# Patient Record
Sex: Female | Born: 1980 | Race: White | Hispanic: No | Marital: Married | Smoking: Never smoker
Health system: Southern US, Community
[De-identification: ages and names within clinical notes are randomized; demographics above are authoritative.]

## PROBLEM LIST (undated history)

## (undated) DIAGNOSIS — R87619 Unspecified abnormal cytological findings in specimens from cervix uteri: Secondary | ICD-10-CM

## (undated) DIAGNOSIS — D259 Leiomyoma of uterus, unspecified: Secondary | ICD-10-CM

## (undated) DIAGNOSIS — N840 Polyp of corpus uteri: Secondary | ICD-10-CM

## (undated) DIAGNOSIS — N939 Abnormal uterine and vaginal bleeding, unspecified: Secondary | ICD-10-CM

## (undated) DIAGNOSIS — I10 Essential (primary) hypertension: Secondary | ICD-10-CM

## (undated) DIAGNOSIS — D649 Anemia, unspecified: Secondary | ICD-10-CM

## (undated) HISTORY — PX: MYOMECTOMY: SHX85

## (undated) HISTORY — DX: Unspecified abnormal cytological findings in specimens from cervix uteri: R87.619

## (undated) HISTORY — PX: TONSILLECTOMY: SUR1361

---
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
## 2006-10-08 ENCOUNTER — Emergency Department: Payer: Self-pay | Admitting: Emergency Medicine

## 2007-06-25 ENCOUNTER — Inpatient Hospital Stay: Payer: Self-pay

## 2007-07-05 ENCOUNTER — Emergency Department: Payer: Self-pay | Admitting: Emergency Medicine

## 2007-08-23 ENCOUNTER — Emergency Department: Payer: Self-pay | Admitting: Unknown Physician Specialty

## 2007-08-23 ENCOUNTER — Other Ambulatory Visit: Payer: Self-pay

## 2010-07-23 ENCOUNTER — Emergency Department: Payer: Self-pay | Admitting: Emergency Medicine

## 2012-12-27 ENCOUNTER — Ambulatory Visit: Payer: Self-pay | Admitting: Obstetrics and Gynecology

## 2015-10-31 ENCOUNTER — Emergency Department
Admission: EM | Admit: 2015-10-31 | Discharge: 2015-11-01 | Disposition: A | Discharge status: Home / Self Care | Payer: BLUE CROSS/BLUE SHIELD | Attending: Emergency Medicine | Admitting: Emergency Medicine

## 2015-10-31 ENCOUNTER — Encounter: Payer: Self-pay | Admitting: Emergency Medicine

## 2015-10-31 DIAGNOSIS — N939 Abnormal uterine and vaginal bleeding, unspecified: Secondary | ICD-10-CM | POA: Insufficient documentation

## 2015-10-31 DIAGNOSIS — Z3202 Encounter for pregnancy test, result negative: Secondary | ICD-10-CM | POA: Diagnosis not present

## 2015-10-31 DIAGNOSIS — N888 Other specified noninflammatory disorders of cervix uteri: Secondary | ICD-10-CM

## 2015-10-31 LAB — CBC WITH DIFFERENTIAL/PLATELET
BASOS ABS: 0.1 10*3/uL (ref 0–0.1)
BASOS PCT: 1 %
EOS PCT: 2 %
Eosinophils Absolute: 0.1 10*3/uL (ref 0–0.7)
HEMATOCRIT: 34 % — AB (ref 35.0–47.0)
Hemoglobin: 11.2 g/dL — ABNORMAL LOW (ref 12.0–16.0)
Lymphocytes Relative: 30 %
Lymphs Abs: 2.2 10*3/uL (ref 1.0–3.6)
MCH: 28 pg (ref 26.0–34.0)
MCHC: 32.8 g/dL (ref 32.0–36.0)
MCV: 85.3 fL (ref 80.0–100.0)
Monocytes Absolute: 0.7 10*3/uL (ref 0.2–0.9)
Monocytes Relative: 9 %
NEUTROS ABS: 4.3 10*3/uL (ref 1.4–6.5)
Neutrophils Relative %: 58 %
PLATELETS: 198 10*3/uL (ref 150–440)
RBC: 3.99 MIL/uL (ref 3.80–5.20)
RDW: 14.7 % — ABNORMAL HIGH (ref 11.5–14.5)
WBC: 7.3 10*3/uL (ref 3.6–11.0)

## 2015-10-31 LAB — BASIC METABOLIC PANEL
ANION GAP: 4 — AB (ref 5–15)
BUN: 11 mg/dL (ref 6–20)
CO2: 29 mmol/L (ref 22–32)
Calcium: 9.1 mg/dL (ref 8.9–10.3)
Chloride: 108 mmol/L (ref 101–111)
Creatinine, Ser: 1.28 mg/dL — ABNORMAL HIGH (ref 0.44–1.00)
GFR calc non Af Amer: 55 mL/min — ABNORMAL LOW (ref >60)
GLUCOSE: 118 mg/dL — AB (ref 65–99)
POTASSIUM: 3.9 mmol/L (ref 3.5–5.1)
Sodium: 141 mmol/L (ref 135–145)

## 2015-10-31 NOTE — ED Provider Notes (Signed)
[facility] Emergency Department Provider Note     Time seen: ----------------------------------------- 11:06 PM on 10/31/2015 -----------------------------------------    I have reviewed the triage vital signs and the nursing notes.   HISTORY  Chief Complaint Vaginal Bleeding    HPI Paige Perez is a 35 y.o. female who presents ER for heavy vaginal bleeding for last 4 weeks. Patient states tonight she had heavy bleeding and that she went to change her tampon without resistance and try to put a tampon in. Patient states she felt with her finger something that felt like a ball or something obstructing her vagina. Patient is a had this before, denies putting anything other than a tampon in her vagina recently.   History reviewed. No pertinent past medical history.  There are no active problems to display for this patient.   History reviewed. No pertinent past surgical history.  Allergies Review of patient's allergies indicates no known allergies.  Social History Social History  Substance Use Topics  . Smoking status: Never Smoker   . Smokeless tobacco: None  . Alcohol Use: Yes     Comment: occasionaly    Review of Systems Constitutional: Negative for fever. Eyes: Negative for visual changes. ENT: Negative for sore throat. Cardiovascular: Negative for chest pain. Respiratory: Negative for shortness of breath. Gastrointestinal: Negative for abdominal pain, vomiting and diarrhea. Genitourinary: Negative for dysuria. Positive for vaginal bleeding and foreign body sensation Musculoskeletal: Negative for back pain. Skin: Negative for rash. Neurological: Negative for headaches, focal weakness or numbness.  10-point ROS otherwise negative.  ____________________________________________   PHYSICAL EXAM:  VITAL SIGNS: ED Triage Vitals  Enc Vitals Group     BP 10/31/15 2233 189/102 mmHg     Pulse Rate 10/31/15 2233 100     Resp 10/31/15  2233 18     Temp 10/31/15 2233 98 F (36.7 C)     Temp Source 10/31/15 2233 Oral     SpO2 10/31/15 2233 100 %     Weight 10/31/15 2233 270 lb (122.471 kg)     Height 10/31/15 2233 5\' 6"  (1.676 m)     Head Cir --      Peak Flow --      Pain Score --      Pain Loc --      Pain Edu? --      Excl. in [place]? --     Constitutional: Alert and oriented. Well appearing and in no distress. Eyes: Conjunctivae are normal. PERRL. Normal extraocular movements. ENT   Head: Normocephalic and atraumatic.   Nose: No congestion/rhinnorhea.   Mouth/Throat: Mucous membranes are moist.   Neck: No stridor. Cardiovascular: Normal rate, regular rhythm. Normal and symmetric distal pulses are present in all extremities. No murmurs, rubs, or gallops. Respiratory: Normal respiratory effort without tachypnea nor retractions. Breath sounds are clear and equal bilaterally. No wheezes/rales/rhonchi. Gastrointestinal: Soft and nontender. No distention. No abdominal bruits.  Genitourinary: Apparent pelvic organ prolapse. I'm not able to completely reduce it. Nontender, mild bleeding Musculoskeletal: Nontender with normal range of motion in all extremities. No joint effusions.  No lower extremity tenderness nor edema. Neurologic:  Normal speech and language. No gross focal neurologic deficits are appreciated. Speech is normal. No gait instability. Skin:  Skin is warm, dry and intact. No rash noted. Psychiatric: Mood and affect are normal. Speech and behavior are normal. Patient exhibits appropriate insight and judgment. ____________________________________________  ED COURSE:  Pertinent labs & imaging results that were available during my  care of the patient were reviewed by me and considered in my medical decision making (see chart for details). Patient is in no acute distress, will perform pelvic exam, check basic labs and reevaluate. ____________________________________________    LABS (pertinent  positives/negatives)  Labs Reviewed  CBC WITH DIFFERENTIAL/PLATELET - Abnormal; Notable for the following:    Hemoglobin 11.2 (*)    HCT 34.0 (*)    RDW 14.7 (*)    All other components within normal limits  BASIC METABOLIC PANEL - Abnormal; Notable for the following:    Glucose, Bld 118 (*)    Creatinine, Ser 1.28 (*)    GFR calc non Af Amer 55 (*)    Anion gap 4 (*)    All other components within normal limits  URINALYSIS COMPLETEWITH MICROSCOPIC ([facility] ONLY) - Abnormal; Notable for the following:    Color, Urine YELLOW (*)    APPearance CLEAR (*)    Hgb urine dipstick 3+ (*)    Leukocytes, UA TRACE (*)    Bacteria, UA RARE (*)    Squamous Epithelial / LPF 0-5 (*)    All other components within normal limits  POC URINE PREG, ED  POCT PREGNANCY, URINE    Pelvic ultrasound:  IMPRESSION: Heterogeneous mass lesion demonstrated in the cervix and lower uterine segment measuring about 4.2 cm maximal diameter. Increased flow is demonstrated within the mass and within the endometrium. Neoplasm should be excluded. Gynecological consultation is suggested. ____________________________________________  FINAL ASSESSMENT AND PLAN  Vaginal bleeding, Mass on the cervix  Plan: Patient with labs and imaging as dictated above. Patient with a mass on her cervix and lower uterine segment. I discussed with Dr. Glennon Mac on call. Patient receives Dr. Laurey Morale, can have a Pap smear or biopsy this week for further evaluation. She will be encouraged to see him on Monday for recheck.   Earleen Newport, MD   Earleen Newport, MD 11/01/15 [phone]

## 2015-10-31 NOTE — ED Notes (Signed)
Patient reports that she has been having vaginal bleeding times 4 weeks. Patient states that tonight she had heavy bleeding. Patient states that when she went to change your tampon she felt resistance when trying to put a tampon in. Patient states that she felt with her finger and felt a "ball".

## 2015-11-01 ENCOUNTER — Emergency Department: Payer: BLUE CROSS/BLUE SHIELD

## 2015-11-01 LAB — URINALYSIS COMPLETE WITH MICROSCOPIC (ARMC ONLY)
Bilirubin Urine: NEGATIVE
Glucose, UA: NEGATIVE mg/dL
Ketones, ur: NEGATIVE mg/dL
NITRITE: NEGATIVE
PH: 6 (ref 5.0–8.0)
Protein, ur: NEGATIVE mg/dL
SPECIFIC GRAVITY, URINE: 1.018 (ref 1.005–1.030)

## 2015-11-01 LAB — POCT PREGNANCY, URINE: Preg Test, Ur: NEGATIVE

## 2015-11-01 NOTE — Discharge Instructions (Signed)
Abnormal Uterine Bleeding Abnormal uterine bleeding means bleeding from the vagina that is not your normal menstrual period. This can be:  Bleeding or spotting between periods.  Bleeding after sex (sexual intercourse).  Bleeding that is heavier or more than normal.  Periods that last longer than usual.  Bleeding after menopause. There are many problems that may cause this. Treatment will depend on the cause of the bleeding. Any kind of bleeding that is not normal should be reviewed by your doctor.  HOME CARE Watch your condition for any changes. These actions may lessen any discomfort you are having:  Do not use tampons or douches as told by your doctor.  Change your pads often. You should get regular pelvic exams and Pap tests. Keep all appointments for tests as told by your doctor. GET HELP IF:  You are bleeding for more than 1 week.  You feel dizzy at times. GET HELP RIGHT AWAY IF:   You pass out.  You have to change pads every 15 to 30 minutes.  You have belly pain.  You have a fever.  You become sweaty or weak.  You are passing large blood clots from the vagina.  You feel sick to your stomach (nauseous) and throw up (vomit). MAKE SURE YOU:  Understand these instructions.  Will watch your condition.  Will get help right away if you are not doing well or get worse.   This information is not intended to replace advice given to you by your health care provider. Make sure you discuss any questions you have with your health care provider.   Document Released: 07/17/2009 Document Revised: 09/24/2013 Document Reviewed: 04/18/2013 Elsevier Interactive Patient Education 2016 Else[employer]  Pelvic Mass A pelvic mass is an abnormal growth in the pelvis. The pelvis is the area between your hip bones. It includes the bladder and the rectum in males and females, and also the uterus and ovaries in females. CAUSES Many things can cause a pelvic mass,  including:  Cancer.  Fibroids of the uterus.  Ovarian cysts.  Infection.  Ectopic pregnancy. SIGNS AND SYMPTOMS Symptoms of a pelvic mass may include:  Cramping.  Nausea.  Diarrhea.  Fever.  Vomiting.  Weakness.  Pain in the pelvis, side, or back.  Weight loss.  Constipation.  Problems with vaginal bleeding, including:  Light or heavy bleeding with or without blood clots.  Irregular menstruation.  Pain with menstruation.  Problems with urination, including:  Frequent urination.  Inability to empty the bladder completely.  Urinating very small amounts.  Pain with urination.  Bloody urine. Some pelvic masses do not cause symptoms. DIAGNOSIS To make a diagnosis, your health care provider will need to learn more about the mass. You may have tests or procedures done, such as:  Blood tests.  X-rays.  Ultrasound.  CT scan.  MRI.  A surgery to look inside of your abdomen with cameras (laparoscopy).  A biopsy that is performed with a needle or during laparoscopy or surgery. In some cases, what seemed like a pelvic mass may actually be something else, such as a mass in one of the organs that are near the pelvis, an infection (abscess) or scar tissue (adhesions) that formed after a surgery. TREATMENT Treatment will depend on the cause of the mass. HOME CARE INSTRUCTIONS What you need to do at home will depend on the cause of the mass. Follow the instructions that your health care provider gives to you. In general:  Keep all follow-up visits as  directed by your health care provider. This is important.  Take medicines only as directed by your health care provider.  Follow any restrictions that are given to you by your health care provider. SEEK MEDICAL CARE IF:  You develop new symptoms. SEEK IMMEDIATE MEDICAL CARE IF:  You vomit bright red blood or vomit material that looks like coffee grounds.  You have blood in your stools, or the stools  turn black and tarry.  You have an abnormal or increased amount of vaginal bleeding.  You have a fever.  You develop easy bruising or bleeding.  You develop sudden or worsening pain that is not controlled by your medicine.  You feel worsening weakness, or you have a fainting episode.  You feel that the mass has suddenly gotten larger.  You develop severe bloating in your abdomen or your pelvis.  You cannot pass any urine.  You are unable to have a bowel movement.   This information is not intended to replace advice given to you by your health care provider. Make sure you discuss any questions you have with your health care provider.   Document Released: 12/27/2006 Document Revised: 10/10/2014 Document Reviewed: 05/05/2014 Elsevier Interactive Patient Education [employer].

## 2015-11-01 NOTE — ED Notes (Signed)
Pt. Will call for appointment today.  Pt. Going home with family.

## 2015-11-04 ENCOUNTER — Inpatient Hospital Stay: Admission: RE | Admit: 2015-11-04 | Payer: Self-pay | Source: Ambulatory Visit

## 2015-11-04 ENCOUNTER — Encounter: Payer: Self-pay | Admitting: *Deleted

## 2015-11-04 NOTE — Patient Instructions (Signed)
  Your procedure is scheduled on: 11-06-15 Report to [place] To find out your arrival time please call [phone] between 1PM - 3PM on 11-05-15  Remember: Instructions that are not followed completely may result in serious medical risk, up to and including death, or upon the discretion of your surgeon and anesthesiologist your surgery may need to be rescheduled.    _X___ 1. Do not eat food or drink liquids after midnight. No gum chewing or hard candies.     _X___ 2. No Alcohol for 24 hours before or after surgery.   ____ 3. Bring all medications with you on the day of surgery if instructed.    ____ 4. Notify your doctor if there is any change in your medical condition     (cold, fever, infections).     Do not wear jewelry, make-up, hairpins, clips or nail polish.  Do not wear lotions, powders, or perfumes. You may wear deodorant.  Do not shave 48 hours prior to surgery. Men may shave face and neck.  Do not bring valuables to the hospital.    [facility] is not responsible for any belongings or valuables.               Contacts, dentures or bridgework may not be worn into surgery.  Leave your suitcase in the car. After surgery it may be brought to your room.  For patients admitted to the hospital, discharge time is determined by your treatment team.   Patients discharged the day of surgery will not be allowed to drive home.   Please read over the following fact sheets that you were given:     ____ Take these medicines the morning of surgery with A SIP OF WATER:    1. NONE  2.   3.   4.  5.  6.  ____ Fleet Enema (as directed)   ____ Use CHG Soap as directed  ____ Use inhalers on the day of surgery  ____ Stop metformin 2 days prior to surgery    ____ Take 1/2 of usual insulin dose the night before surgery and none on the morning of surgery.   ____ Stop Coumadin/Plavix/aspirin-N/A  ____ Stop Anti-inflammatories-NO NSAIDS OR ASA  PRODUCTS-TYLENOL OK TO TAKE   ____ Stop supplements until after surgery.    ____ Bring C-Pap to the hospital.

## 2015-11-06 ENCOUNTER — Ambulatory Visit: Payer: BLUE CROSS/BLUE SHIELD | Admitting: Anesthesiology

## 2015-11-06 ENCOUNTER — Ambulatory Visit
Admission: RE | Admit: 2015-11-06 | Discharge: 2015-11-06 | Disposition: A | Discharge status: Home / Self Care | Payer: BLUE CROSS/BLUE SHIELD | Source: Ambulatory Visit | Attending: Obstetrics and Gynecology | Admitting: Obstetrics and Gynecology

## 2015-11-06 ENCOUNTER — Encounter: Payer: Self-pay | Admitting: Anesthesiology

## 2015-11-06 ENCOUNTER — Encounter
Admission: RE | Disposition: A | Discharge status: Home / Self Care | Payer: Self-pay | Source: Ambulatory Visit | Attending: Obstetrics and Gynecology

## 2015-11-06 DIAGNOSIS — Z820 Family history of epilepsy and other diseases of the nervous system: Secondary | ICD-10-CM | POA: Insufficient documentation

## 2015-11-06 DIAGNOSIS — N711 Chronic inflammatory disease of uterus: Secondary | ICD-10-CM | POA: Diagnosis not present

## 2015-11-06 DIAGNOSIS — Z833 Family history of diabetes mellitus: Secondary | ICD-10-CM | POA: Diagnosis not present

## 2015-11-06 DIAGNOSIS — Z79899 Other long term (current) drug therapy: Secondary | ICD-10-CM | POA: Diagnosis not present

## 2015-11-06 DIAGNOSIS — D251 Intramural leiomyoma of uterus: Secondary | ICD-10-CM | POA: Insufficient documentation

## 2015-11-06 DIAGNOSIS — N888 Other specified noninflammatory disorders of cervix uteri: Secondary | ICD-10-CM | POA: Diagnosis present

## 2015-11-06 HISTORY — PX: DILATATION & CURETTAGE/HYSTEROSCOPY WITH MYOSURE: SHX6511

## 2015-11-06 HISTORY — DX: Anemia, unspecified: D64.9

## 2015-11-06 HISTORY — PX: HYSTEROSCOPY: SHX211

## 2015-11-06 LAB — TYPE AND SCREEN
ABO/RH(D): A POS
Antibody Screen: NEGATIVE

## 2015-11-06 LAB — CBC
HEMATOCRIT: 32.8 % — AB (ref 35.0–47.0)
Hemoglobin: 10.9 g/dL — ABNORMAL LOW (ref 12.0–16.0)
MCH: 27.9 pg (ref 26.0–34.0)
MCHC: 33.1 g/dL (ref 32.0–36.0)
MCV: 84.3 fL (ref 80.0–100.0)
Platelets: 218 10*3/uL (ref 150–440)
RBC: 3.89 MIL/uL (ref 3.80–5.20)
RDW: 14.6 % — AB (ref 11.5–14.5)
WBC: 7.3 10*3/uL (ref 3.6–11.0)

## 2015-11-06 LAB — COMPREHENSIVE METABOLIC PANEL
ALT: 12 U/L — AB (ref 14–54)
AST: 14 U/L — AB (ref 15–41)
Albumin: 3.3 g/dL — ABNORMAL LOW (ref 3.5–5.0)
Alkaline Phosphatase: 49 U/L (ref 38–126)
Anion gap: 7 (ref 5–15)
BILIRUBIN TOTAL: 0.5 mg/dL (ref 0.3–1.2)
BUN: 13 mg/dL (ref 6–20)
CO2: 26 mmol/L (ref 22–32)
CREATININE: 0.98 mg/dL (ref 0.44–1.00)
Calcium: 8.8 mg/dL — ABNORMAL LOW (ref 8.9–10.3)
Chloride: 105 mmol/L (ref 101–111)
Glucose, Bld: 99 mg/dL (ref 65–99)
Potassium: 4.1 mmol/L (ref 3.5–5.1)
Sodium: 138 mmol/L (ref 135–145)
TOTAL PROTEIN: 6.9 g/dL (ref 6.5–8.1)

## 2015-11-06 LAB — POCT PREGNANCY, URINE: PREG TEST UR: NEGATIVE

## 2015-11-06 LAB — ABO/RH: ABO/RH(D): A POS

## 2015-11-06 SURGERY — DILATATION & CURETTAGE/HYSTEROSCOPY WITH MYOSURE
Anesthesia: General | Site: Vagina | Wound class: Clean Contaminated

## 2015-11-06 MED ORDER — DEXAMETHASONE SODIUM PHOSPHATE 10 MG/ML IJ SOLN
INTRAMUSCULAR | Status: DC | PRN
  Administered 2015-11-06: 10 mg via INTRAVENOUS

## 2015-11-06 MED ORDER — FENTANYL CITRATE (PF) 100 MCG/2ML IJ SOLN: 25.0000 ug | INTRAMUSCULAR | Status: DC | PRN

## 2015-11-06 MED ORDER — HYDROCODONE-ACETAMINOPHEN 5-325 MG PO TABS: 1.0000 | ORAL_TABLET | Freq: Four times a day (QID) | ORAL | Status: DC | PRN

## 2015-11-06 MED ORDER — ONDANSETRON HCL 4 MG/2ML IJ SOLN
INTRAMUSCULAR | Status: DC | PRN
  Administered 2015-11-06: 4 mg via INTRAVENOUS

## 2015-11-06 MED ORDER — LACTATED RINGERS IV SOLN
INTRAVENOUS | Status: DC
  Administered 2015-11-06: 125 mL/h via INTRAVENOUS
  Administered 2015-11-06: 12:00:00 via INTRAVENOUS

## 2015-11-06 MED ORDER — ONDANSETRON HCL 4 MG/2ML IJ SOLN
INTRAMUSCULAR | Status: AC
  Administered 2015-11-06: 4 mg via INTRAVENOUS
  Filled 2015-11-06: qty 2

## 2015-11-06 MED ORDER — 0.9 % SODIUM CHLORIDE (POUR BTL) OPTIME
TOPICAL | Status: DC | PRN
  Administered 2015-11-06: 500 mL

## 2015-11-06 MED ORDER — ONDANSETRON HCL 4 MG/2ML IJ SOLN
4.0000 mg | Freq: Once | INTRAMUSCULAR | Status: AC | PRN
  Administered 2015-11-06: 4 mg via INTRAVENOUS

## 2015-11-06 MED ORDER — LACTATED RINGERS IR SOLN
Status: DC | PRN
  Administered 2015-11-06: 1000 mL
  Administered 2015-11-06: 3000 mL
  Administered 2015-11-06: 1000 mL
  Administered 2015-11-06 (×3): 3000 mL

## 2015-11-06 MED ORDER — MIDAZOLAM HCL 2 MG/2ML IJ SOLN
INTRAMUSCULAR | Status: DC | PRN
  Administered 2015-11-06: 2 mg via INTRAVENOUS

## 2015-11-06 MED ORDER — LIDOCAINE HCL (CARDIAC) 20 MG/ML IV SOLN
INTRAVENOUS | Status: DC | PRN
  Administered 2015-11-06: 80 mg via INTRAVENOUS

## 2015-11-06 MED ORDER — LACTATED RINGERS IV SOLN: INTRAVENOUS | Status: DC

## 2015-11-06 MED ORDER — FAMOTIDINE 20 MG PO TABS
20.0000 mg | ORAL_TABLET | Freq: Once | ORAL | Status: AC
  Administered 2015-11-06: 20 mg via ORAL

## 2015-11-06 MED ORDER — GLYCOPYRROLATE 0.2 MG/ML IJ SOLN
INTRAMUSCULAR | Status: DC | PRN
  Administered 2015-11-06: 0.2 mg via INTRAVENOUS

## 2015-11-06 MED ORDER — GLYCINE 1.5 % IR SOLN
Status: DC | PRN
  Administered 2015-11-06 (×4): 3000 mL

## 2015-11-06 MED ORDER — FENTANYL CITRATE (PF) 100 MCG/2ML IJ SOLN
INTRAMUSCULAR | Status: DC | PRN
  Administered 2015-11-06 (×4): 50 ug via INTRAVENOUS

## 2015-11-06 MED ORDER — PROPOFOL 10 MG/ML IV BOLUS
INTRAVENOUS | Status: DC | PRN
  Administered 2015-11-06: 200 mg via INTRAVENOUS

## 2015-11-06 MED ORDER — SILVER NITRATE-POT NITRATE 75-25 % EX MISC
CUTANEOUS | Status: DC | PRN
  Administered 2015-11-06: 6

## 2015-11-06 MED ORDER — IBUPROFEN 600 MG PO TABS: 600.0000 mg | ORAL_TABLET | Freq: Four times a day (QID) | ORAL | Status: DC | PRN

## 2015-11-06 MED ORDER — FAMOTIDINE 20 MG PO TABS
ORAL_TABLET | ORAL | Status: AC
  Administered 2015-11-06: 20 mg via ORAL
  Filled 2015-11-06: qty 1

## 2015-11-06 SURGICAL SUPPLY — 34 items
ABLATOR ENDOMETRIAL MYOSURE (ABLATOR) ×6 IMPLANT
CANISTER SUC SOCK COL 7IN (MISCELLANEOUS) ×3 IMPLANT
CANISTER SUCT 3000ML PPV (MISCELLANEOUS) ×27 IMPLANT
CATH ROBINSON RED A/P 16FR (CATHETERS) ×3 IMPLANT
CORD URO TURP 10FT (MISCELLANEOUS) ×3 IMPLANT
ELECT REM PT RETURN 9FT ADLT (ELECTROSURGICAL) ×3
ELECT RESECT POWERBALL 24F (MISCELLANEOUS) ×3 IMPLANT
ELECTRODE REM PT RTRN 9FT ADLT (ELECTROSURGICAL) ×1 IMPLANT
GAUZE SPONGE 4X4 12PLY STRL (GAUZE/BANDAGES/DRESSINGS) ×3 IMPLANT
GLOVE BIO SURGEON STRL SZ 6.5 (GLOVE) ×6 IMPLANT
GLOVE BIO SURGEON STRL SZ7 (GLOVE) ×3 IMPLANT
GLOVE BIO SURGEONS STRL SZ 6.5 (GLOVE) ×3
GLOVE BIOGEL PI IND STRL 7.0 (GLOVE) ×1 IMPLANT
GLOVE BIOGEL PI IND STRL 7.5 (GLOVE) ×1 IMPLANT
GLOVE BIOGEL PI INDICATOR 7.0 (GLOVE) ×2
GLOVE BIOGEL PI INDICATOR 7.5 (GLOVE) ×2
GOWN STRL REUS W/ TWL LRG LVL3 (GOWN DISPOSABLE) ×1 IMPLANT
GOWN STRL REUS W/TWL LRG LVL3 (GOWN DISPOSABLE) ×2
HANDLE YANKAUER SUCT BULB TIP (MISCELLANEOUS) ×3 IMPLANT
IV LACTATED RINGER IRRG 3000ML (IV SOLUTION) ×2
IV LR IRRIG 3000ML ARTHROMATIC (IV SOLUTION) ×1 IMPLANT
KIT RM TURNOVER CYSTO AR (KITS) ×3 IMPLANT
LOOP SUT CHROMIC 0 SGL3 (SUTURE) ×9 IMPLANT
LOOP SUT CHROMIC 2-0 SGL1 (SUTURE) ×3 IMPLANT
MYOSURE LITE POLYP REMOVAL (MISCELLANEOUS) ×3 IMPLANT
PACK DNC HYST (MISCELLANEOUS) ×3 IMPLANT
PAD OB MATERNITY 4.3X12.25 (PERSONAL CARE ITEMS) ×3 IMPLANT
PAD PREP 24X41 OB/GYN DISP (PERSONAL CARE ITEMS) ×3 IMPLANT
PENCIL ELECTRO HAND CTR (MISCELLANEOUS) ×3 IMPLANT
SPONGE XRAY 4X4 16PLY STRL (MISCELLANEOUS) ×9 IMPLANT
TUBING CONNECTING 10 (TUBING) ×6 IMPLANT
TUBING CONNECTING 10' (TUBING) ×3
TUBING HYSTEROSCOPY DOLPHIN (MISCELLANEOUS) ×6 IMPLANT
YANKAUER SUCT BULB TIP FLEX NO (MISCELLANEOUS) ×3 IMPLANT

## 2015-11-06 NOTE — Anesthesia Postprocedure Evaluation (Signed)
Anesthesia Post Note  Patient: Paige Perez  Procedure(s) Performed: Procedure(s) (LRB): DILATATION & CURETTAGE WITH MY SURE (N/A) HYSTEROSCOPY WITH REMOVAL OF CERVICAL MASS (N/A)  Patient location during evaluation: PACU Anesthesia Type: General Level of consciousness: awake and alert Pain management: pain level controlled Vital Signs Assessment: post-procedure vital signs reviewed and stable Respiratory status: spontaneous breathing, nonlabored ventilation, respiratory function stable and patient connected to nasal cannula oxygen Cardiovascular status: blood pressure returned to baseline and stable Postop Assessment: no signs of nausea or vomiting Anesthetic complications: no    Last Vitals:  Filed Vitals:   11/06/15 1426 11/06/15 1444  BP: 139/78 131/74  Pulse: 90 87  Temp: 36.3 C   Resp: 16     Last Pain:  Filed Vitals:   11/06/15 1446  PainSc: Asleep                 Leeandra Ellerson S

## 2015-11-06 NOTE — Anesthesia Preprocedure Evaluation (Signed)
Anesthesia Evaluation  Patient identified by MRN, date of birth, ID band Patient awake    Reviewed: Allergy & Precautions, NPO status , Patient's Chart, lab work & pertinent test results, reviewed documented beta blocker date and time   Airway Mallampati: III  TM Distance: >3 FB     Dental  (+) Chipped   Pulmonary           Cardiovascular      Neuro/Psych    GI/Hepatic   Endo/Other    Renal/GU      Musculoskeletal   Abdominal   Peds  Hematology  (+) anemia ,   Anesthesia Other Findings Obesity.  Reproductive/Obstetrics                             Anesthesia Physical Anesthesia Plan  ASA: III  Anesthesia Plan: General   Post-op Pain Management:    Induction: Intravenous  Airway Management Planned: LMA  Additional Equipment:   Intra-op Plan:   Post-operative Plan:   Informed Consent: I have reviewed the patients History and Physical, chart, labs and discussed the procedure including the risks, benefits and alternatives for the proposed anesthesia with the patient or authorized representative who has indicated his/her understanding and acceptance.     Plan Discussed with: CRNA  Anesthesia Plan Comments:         Anesthesia Quick Evaluation

## 2015-11-06 NOTE — Transfer of Care (Signed)
Immediate Anesthesia Transfer of Care Note  Patient: Trevor Kehn  Procedure(s) Performed: Procedure(s): DILATATION & CURETTAGE WITH MY SURE (N/A) HYSTEROSCOPY WITH REMOVAL OF CERVICAL MASS (N/A)  Patient Location: PACU  Anesthesia Type:General  Level of Consciousness: awake, alert , oriented and patient cooperative  Airway & Oxygen Therapy: Patient Spontanous Breathing and Patient connected to face mask oxygen  Post-op Assessment: Report given to RN, Post -op Vital signs reviewed and stable and Patient moving all extremities X 4  Post vital signs: Reviewed and stable  Last Vitals:  Filed Vitals:   11/06/15 0823 11/06/15 1221  BP: 147/76 141/81  Pulse: 80 94  Temp: 36.8 C 36.5 C  Resp: 16 20    Complications: No apparent anesthesia complications

## 2015-11-06 NOTE — Anesthesia Procedure Notes (Signed)
Procedure Name: LMA Insertion Date/Time: 11/06/2015 9:13 AM Performed by: Silvana Newness Pre-anesthesia Checklist: Patient identified, Emergency Drugs available, Suction available, Patient being monitored and Timeout performed Patient Re-evaluated:Patient Re-evaluated prior to inductionOxygen Delivery Method: Circle system utilized Preoxygenation: Pre-oxygenation with 100% oxygen Intubation Type: IV induction Ventilation: Mask ventilation without difficulty LMA: LMA inserted LMA Size: 3.5 Number of attempts: 1 Placement Confirmation: positive ETCO2 and breath sounds checked- equal and bilateral Tube secured with: Tape Dental Injury: Teeth and Oropharynx as per pre-operative assessment

## 2015-11-06 NOTE — Brief Op Note (Signed)
11/06/2015  12:10 PM  PATIENT:  Michelle Nasuti  35 y.o. female  PRE-OPERATIVE DIAGNOSIS:  CERVICAL MASS  POST-OPERATIVE DIAGNOSIS:  CERVICAL MASS  PROCEDURE:  Procedure(s): DILATATION & CURETTAGE WITH MY SURE (N/A) HYSTEROSCOPY WITH REMOVAL OF CERVICAL MASS (N/A)  SURGEON:  Surgeon(s) and Role:    * Will Bonnet, MD - Primary  PHYSICIAN ASSISTANT: Sophia Caccavale, PA-S  ANESTHESIA:   general  EBL:  350 mL  IVF: 1,300 mL crystalloid  UOP: 150 mL clear urine  Fluid Deficit: 500 mL  BLOOD ADMINISTERED:none  DRAINS: none   LOCAL MEDICATIONS USED:  NONE  SPECIMEN:  Source of Specimen:  Cervical mass  DISPOSITION OF SPECIMEN:  PATHOLOGY  COUNTS:  YES  TOURNIQUET:  * No tourniquets in log *  DICTATION: .Note written in EPIC  PLAN OF CARE: Discharge to home after PACU  PATIENT DISPOSITION:  PACU - hemodynamically stable.   Delay start of Pharmacological VTE agent (>24hrs) due to surgical blood loss or risk of bleeding: yes  Prentice Docker, MD 11/06/2015 12:12 PM

## 2015-11-06 NOTE — Op Note (Signed)
Operative Report  Pre-Op Diagnosis: Cervical Mass  Post-Op Diagnosis: Cervical Mass  Procedures:  1. Hysteroscopy, dilation and curettage 2. Removal of cervical mass  Primary Surgeon: Prentice Docker, MD   Assistant Surgeon: Franchot Heidelberg, PA-S  EBL: 350 ml   IVF: 1,300 mL   Urine output: 150 mL  Fluid deficit: 500 mL  Specimens: Cervical Mass for permanent  Drains: None  Complications: None   Disposition: PACU   Condition: Stable   Findings:  1) 5-6 cm Cervical mass attached to internal cervix near internal posterior cervical os protruding through external cervical os into the vagina. 2) Normal appearing uterine cavity with no apparent masses  Indication:  The patient is a 35 y.o. female who presented to the ER several days ago for persistent vaginal bleeding with passage of clots.  She was examined and a large cervical mass was noted.  An ultrasound was obtained verifying the presence of this mass attached at either the lower uterine segment or near the internal cervical os.  She was evaluated in clinic this week with the same finding noted.  She agreed to proceed with the recommended course of treatment, which was surgical removal.  Procedure Summary:  The patient was taken to the operating room where she was placed under general anesthesia, placed in the dorsal supine lithotomy position in candycane stirrups, and prepped and draped in the usual sterile fashion. After a timeout was called her bladder was emptied using in-and-out catheterization and a sterile speculum was placed in the vagina. The cervical mass was visualized. Initially an Endoloop was used to attempt to encircle the mass at the base and reduce blood supply. This allowed for amputation of the majority of the mass. The hysteroscope was introduced through the already very dilated cervix. The uterine cavity was visualized and found to be as above. The Myosure device was used to remove the rest of the  mass. The surgery was technically difficult due to fluid loss at the cervix. An attempt to reduce fluid loss was undertaken by clamping the cervix around the hysteroscope. This did reduce the amount of fluid loss but it was still substantial. The stump of the mass began bleeding during use of the my Essure device. There was not sufficient pressure that could be maintained by intrauterine uterine fluid to attain hemostasis. Therefore the Myosure hysteroscope was replaced with a resectoscope. The resectoscope was gently introduced through the cervix and the cervix was clamped around the scope. Using monopolar electrocautery the base of the mass was cauterized and hemostasis was obtained. The rest of the blood clots were removed from the uterine cavity. A gentle curetting was undertaken to sample the uterine cavity. There was a small amount of oozing from the cervix at the end of the case. Therefore, silver nitrate was applied along the length of the posterior cervix and hemostasis was noted. The cervix was monitored for approximately 10 minutes at this point and no additional bleeding was noted. It should be noted that the bleeding from the stump of the mass was brisk at first. The Dolphin fluid management system was used to monitor fluid balance. After assurance of hemostasis all instrumentation was removed from the vagina. The bladder was once again emptied via in-and-out catheterization.  The patient tolerated the procedure well. Sponge, lap, and instrument counts were correct 2. For VTE prophylaxis the patient wore pneumatic compression stockings throughout the entire procedure. The patient was awakened in the operating room and taken to the recovery area in stable  condition.  Prentice Docker, MD 11/06/2015 1:07 PM

## 2015-11-06 NOTE — H&P (Signed)
History and Physical Interval Note:  Paige Perez  has presented today for surgery, with the diagnosis of CERVICAL MASS  The various methods of treatment have been discussed with the patient and family. After consideration of risks, benefits and other options for treatment, the patient has consented to  Procedure(s): DILATATION & CURETTAGE/HYSTEROSCOPY WITH MYOSURE (N/A) and removal of cervical mass as a surgical intervention .  The patient's history has been reviewed, patient examined, no change in status, stable for surgery.  I have reviewed the patient's chart and labs.  Questions were answered to the patient's satisfaction.  The consent form was reviewed with the patient and she, again, agrees to the proposed procedure.  Will Bonnet, MD 11/06/2015 8:57 AM

## 2015-11-09 ENCOUNTER — Encounter: Payer: Self-pay | Admitting: Obstetrics and Gynecology

## 2015-11-09 LAB — SURGICAL PATHOLOGY

## 2017-05-22 ENCOUNTER — Other Ambulatory Visit: Payer: Self-pay | Admitting: Obstetrics and Gynecology

## 2017-05-23 ENCOUNTER — Telehealth: Payer: Self-pay | Admitting: Obstetrics and Gynecology

## 2017-05-23 ENCOUNTER — Other Ambulatory Visit: Payer: Self-pay

## 2017-05-23 DIAGNOSIS — Z309 Encounter for contraceptive management, unspecified: Secondary | ICD-10-CM

## 2017-05-23 MED ORDER — NORGESTIMATE-ETH ESTRADIOL 0.25-35 MG-MCG PO TABS: 1.0000 | ORAL_TABLET | Freq: Every day | ORAL | 1 refills | Status: DC

## 2017-05-23 NOTE — Telephone Encounter (Signed)
Unable to leave message for patient making her aware of refill being sent to pharmacy.

## 2017-05-23 NOTE — Telephone Encounter (Signed)
Pt is requesting an refill for her Birthcontrol refill. [facility] [address]. Pt is schedule for annual Oct 12 with Dr. Glennon Mac

## 2017-07-14 ENCOUNTER — Encounter: Payer: Self-pay | Admitting: Obstetrics and Gynecology

## 2017-07-14 ENCOUNTER — Ambulatory Visit: Payer: Self-pay | Admitting: Obstetrics and Gynecology

## 2017-07-14 ENCOUNTER — Ambulatory Visit (INDEPENDENT_AMBULATORY_CARE_PROVIDER_SITE_OTHER): Payer: BLUE CROSS/BLUE SHIELD | Admitting: Obstetrics and Gynecology

## 2017-07-14 VITALS — BP 128/84 | Ht 66.0 in | Wt 275.0 lb

## 2017-07-14 DIAGNOSIS — Z1339 Encounter for screening examination for other mental health and behavioral disorders: Secondary | ICD-10-CM

## 2017-07-14 DIAGNOSIS — Z124 Encounter for screening for malignant neoplasm of cervix: Secondary | ICD-10-CM

## 2017-07-14 DIAGNOSIS — Z01419 Encounter for gynecological examination (general) (routine) without abnormal findings: Secondary | ICD-10-CM

## 2017-07-14 DIAGNOSIS — Z6841 Body Mass Index (BMI) 40.0 and over, adult: Secondary | ICD-10-CM

## 2017-07-14 DIAGNOSIS — Z113 Encounter for screening for infections with a predominantly sexual mode of transmission: Secondary | ICD-10-CM | POA: Diagnosis not present

## 2017-07-14 DIAGNOSIS — Z3041 Encounter for surveillance of contraceptive pills: Secondary | ICD-10-CM | POA: Diagnosis not present

## 2017-07-14 DIAGNOSIS — Z1331 Encounter for screening for depression: Secondary | ICD-10-CM

## 2017-07-14 LAB — HM PAP SMEAR

## 2017-07-14 MED ORDER — NORGESTIMATE-ETH ESTRADIOL 0.25-35 MG-MCG PO TABS: 1.0000 | ORAL_TABLET | Freq: Every day | ORAL | 4 refills | Status: DC

## 2017-07-14 NOTE — Progress Notes (Signed)
Gynecology Annual Exam   PCP: Patient, No Pcp Per  Chief Complaint  Patient presents with  . Annual Exam    History of Present Illness:  [name] is a 36 y.o. [phone] who LMP was No LMP recorded., presents today for her annual examination.  Her menses are regular every 28-30 days, lasting 6 day(s).  Dysmenorrhea none. She does not have intermenstrual bleeding.  She is single partner, contraception - OCP (estrogen/progesterone).  Last Pap: 05/2015  Results were: no abnormalities /neg HPV DNA negative Hx of STDs: trichomonas  Last mammogram: n/a There is no FH of breast cancer. There is no FH of ovarian cancer. The patient does not do self-breast exams.  Tobacco use: The patient denies current or previous tobacco use. Alcohol use: social drinker Exercise: not active  The patient wears seatbelts: yes.  Discussed weight issues with patient.  Discussed management options, including dietary, behavioral, medication, and surgery. She will consider.    Past Medical History:  Diagnosis Date  . Abnormal Pap smear of cervix     Past Surgical History:  Procedure Laterality Date  . CESAREAN SECTION    . MYOMECTOMY      Prior to Admission medications   Medication Sig Start Date End Date Taking? Authorizing Provider  norgestimate-ethinyl estradiol (ORTHO-CYCLEN,SPRINTEC,PREVIFEM) 0.25-35 MG-MCG tablet Take 1 tablet by mouth daily.   Yes [provider]   Allergies No Known Allergies  Obstetric History: [id number], s/p C-section x 2  Social History   Social History  . Marital status: Married    Spouse name: N/A  . Number of children: N/A  . Years of education: N/A   Occupational History  . Not on file.   Social History Main Topics  . Smoking status: Never Smoker  . Smokeless tobacco: Never Used  . Alcohol use Yes     Comment: occasionaly  . Drug use: No  . Sexual activity: Yes    Birth control/ protection: Pill   Other Topics Concern  . Not on  file   Social History Narrative  . No narrative on file    Family History  Problem Relation Age of Onset  . Seizures Mother   . Diabetes Mellitus II Maternal Grandmother     Review of Systems  Constitutional: Negative.   HENT: Negative.   Eyes: Negative.   Respiratory: Negative.   Cardiovascular: Negative.   Gastrointestinal: Negative.   Genitourinary: Negative.   Musculoskeletal: Negative.   Skin: Negative.   Neurological: Negative.   Psychiatric/Behavioral: Negative.      Physical Exam BP 128/84   Ht 5\' 6"  (1.676 m)   Wt 275 lb (124.7 kg)   BMI 44.39 kg/m    Physical Exam  Constitutional: She is oriented to person, place, and time. She appears well-developed and well-nourished. No distress.  Genitourinary: Vagina normal and uterus normal. Pelvic exam was performed with patient supine. There is no rash, tenderness or lesion on the right labia. There is no rash, tenderness or lesion on the left labia. Right adnexum does not display mass, does not display tenderness and does not display fullness. Left adnexum does not display mass, does not display tenderness and does not display fullness. Cervix does not exhibit motion tenderness, lesion or polyp.   Uterus is mobile and anteverted. Uterus is not enlarged, tender, exhibiting a mass or irregular (is regular).  HENT:  Head: Normocephalic and atraumatic.  Eyes: EOM are normal. No scleral icterus.  Neck: Normal range of motion.  Neck supple. No thyromegaly present.  Cardiovascular: Normal rate and regular rhythm.  Exam reveals no gallop and no friction rub.   No murmur heard. Pulmonary/Chest: Effort normal and breath sounds normal. No respiratory distress. She has no wheezes. She has no rales.  Abdominal: Soft. Bowel sounds are normal. She exhibits no distension and no mass. There is no tenderness. There is no rebound and no guarding.  Musculoskeletal: Normal range of motion. She exhibits no edema.  Lymphadenopathy:    She  has no cervical adenopathy.  Neurological: She is alert and oriented to person, place, and time. No cranial nerve deficit.  Skin: Skin is warm and dry. No erythema.  Psychiatric: She has a normal mood and affect. Her behavior is normal. Judgment normal.   Female chaperone present for pelvic and breast  portions of the physical exam  Results: AUDIT Questionnaire (screen for alcoholism): 0 PHQ-9: 0   Assessment: 36 y.o. L4T6256 female here for routine annual gynecologic examination.  Plan: Problem List Items Addressed This Visit    Morbid obesity ([place]) (Chronic)   BMI 40.0-44.9, adult ([place])    Other Visit Diagnoses    Women's annual routine gynecological examination    -  Primary   Screening for depression       Screening for alcohol problem       Pap smear for cervical cancer screening       Relevant Orders   IGP,CtNgTv,rfx Aptima HPV ASCU   Screen for STD (sexually transmitted disease)       Relevant Orders   IGP,CtNgTv,rfx Aptima HPV ASCU   Encounter for surveillance of contraceptive pills       Relevant Medications   norgestimate-ethinyl estradiol (ORTHO-CYCLEN,SPRINTEC,PREVIFEM) 0.25-35 MG-MCG tablet     Screening: -- Blood pressure screen normal -- Colonoscopy - not due -- Mammogram - not due -- Weight screening: obese: discussed management options, including lifestyle, dietary, and exercise. -- Depression screening negative (PHQ-9) -- Nutrition: normal -- cholesterol screening: not due for screening -- osteoporosis screening: not due -- tobacco screening: not using -- alcohol screening: AUDIT questionnaire indicates low-risk usage. -- family history of breast cancer screening: done. not at high risk. -- no evidence of domestic violence or intimate partner violence. -- STD screening: gonorrhea/chlamydia NAAT collected -- pap smear collected per ASCCP guidelines -- flu vaccine PATIENT HAS RECEIVED -- HPV vaccination series: not eligilbe  Prentice Docker,  MD 07/14/2017 10:12 AM

## 2017-07-18 LAB — IGP,CTNGTV,RFX APTIMA HPV ASCU
CHLAMYDIA, NUC. ACID AMP: NEGATIVE
Gonococcus, Nuc. Acid Amp: NEGATIVE
PAP SMEAR COMMENT: 0
Trich vag by NAA: NEGATIVE

## 2017-07-27 ENCOUNTER — Telehealth: Payer: Self-pay | Admitting: Obstetrics and Gynecology

## 2017-07-27 NOTE — Telephone Encounter (Signed)
Left generic VM 

## 2017-08-03 ENCOUNTER — Encounter: Payer: Self-pay | Admitting: Obstetrics and Gynecology

## 2017-08-03 NOTE — Telephone Encounter (Signed)
Pt is returning Dr. Glennon Mac call. Please advise

## 2017-08-03 NOTE — Telephone Encounter (Signed)
Left another generic VM

## 2017-08-03 NOTE — Telephone Encounter (Signed)
Discussed with patient.  Recommended colposcopy. She will call to schedule.  Letter sent, as well.

## 2017-08-28 ENCOUNTER — Ambulatory Visit: Payer: BLUE CROSS/BLUE SHIELD | Admitting: Obstetrics and Gynecology

## 2017-09-07 ENCOUNTER — Ambulatory Visit: Payer: BLUE CROSS/BLUE SHIELD | Admitting: Obstetrics and Gynecology

## 2017-09-08 ENCOUNTER — Ambulatory Visit (INDEPENDENT_AMBULATORY_CARE_PROVIDER_SITE_OTHER): Payer: BLUE CROSS/BLUE SHIELD | Admitting: Obstetrics and Gynecology

## 2017-09-08 ENCOUNTER — Encounter: Payer: Self-pay | Admitting: Obstetrics and Gynecology

## 2017-09-08 VITALS — BP 128/82 | Ht 66.0 in | Wt 280.0 lb

## 2017-09-08 DIAGNOSIS — R87612 Low grade squamous intraepithelial lesion on cytologic smear of cervix (LGSIL): Secondary | ICD-10-CM | POA: Diagnosis not present

## 2017-09-08 NOTE — Progress Notes (Signed)
HPI:  Paige Perez is a 36 y.o.  G2P2001  who presents today for evaluation and management of abnormal cervical cytology.    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 Abnormal Pap smear of cervix   . Anemia     Past Surgical History:  Procedure Laterality Date  . CESAREAN SECTION    . CESAREAN SECTION    . DILATATION & CURETTAGE/HYSTEROSCOPY WITH MYOSURE N/A 11/06/2015   Procedure: DILATATION & CURETTAGE WITH MY SURE;  Surgeon: Will Bonnet, MD;  Location: [facility] ORS;  Service: Gynecology;  Laterality: N/A;  . HYSTEROSCOPY N/A 11/06/2015   Procedure: HYSTEROSCOPY WITH REMOVAL OF CERVICAL MASS;  Surgeon: Will Bonnet, MD;  Location: [facility] ORS;  Service: Gynecology;  Laterality: N/A;  . MYOMECTOMY    . TONSILLECTOMY     age36    SOCIAL HISTORY:  Social History   Substance and Sexual Activity  Alcohol Use Yes   Comment: occasionaly    Social History   Substance and Sexual Activity  Drug Use No     Family History  Problem Relation Age of Onset  . Seizures Mother   . Diabetes Mellitus II Maternal Grandmother     ALLERGIES:  Patient has no known allergies.  Current Outpatient Medications on File Prior to Visit  Medication Sig Dispense Refill  . Ferrous Sulfate (IRON) 325 (65 Fe) MG TABS Take 1 tablet by mouth daily.    Marland Kitchen HYDROcodone-acetaminophen (NORCO) 5-325 MG tablet Take 1 tablet by mouth every 6 (six) hours as needed for moderate pain. 30 tablet 0  . ibuprofen (ADVIL,MOTRIN) 600 MG tablet Take 1 tablet (600 mg total) by mouth every 6 (six) hours as needed for mild pain or cramping. 30 tablet 0  . norgestimate-ethinyl estradiol (ORTHO-CYCLEN,SPRINTEC,PREVIFEM) 0.25-35 MG-MCG tablet Take  1 tablet by mouth daily. 3 Package 4  . norgestimate-ethinyl estradiol (SPRINTEC 28) 0.25-35 MG-MCG tablet Take 1 tablet by mouth daily. 1 Package 1   No current facility-administered medications on file prior to visit.     Physical Exam: -Vitals:  BP 128/82   Ht 5\' 6"  (1.676 m)   Wt 280 lb (127 kg)   LMP 09/01/2017   BMI 45.19 kg/m  GEN: WD, WN, NAD.  A+ O x 3, good mood and affect. ABD:  NT, ND.  Soft, no masses.  No hernias noted.   Pelvic:   Vulva: Normal appearance.  No lesions.  Vagina: No lesions or abnormalities noted.  Support: Normal pelvic support.  Urethra No masses tenderness or scarring.  Meatus Normal size without lesions or prolapse.  Cervix: See below.  Anus: Normal exam.  No lesions.  Perineum: Normal exam.  No lesions.        Bimanual   Uterus: Normal size.  Non-tender.  Mobile.  AV.  Adnexae: No masses.  Non-tender to palpation.  Cul-de-sac: Negative for abnormality.   PROCEDURE: 1.  Urine Pregnancy Test:  negative 2.  Colposcopy performed with 4% acetic acid after verbal consent obtained                                         -  Aceto-white Lesions Location(s): anteriorly.              -Biopsy performed at 2 and 11 o'clock               -ECC indicated and performed: Yes.       -Biopsy sites made hemostatic with pressure, AgNO3, and/or Monsel's solution   -Satisfactory colposcopy: No.    -Evidence of Invasive cervical CA :  NO  ASSESSMENT:  Paige Perez is a 36 y.o. G2P2001 here for  1. LGSIL on Pap smear of cervix   .  PLAN: I discussed the grading system of pap smears and HPV high risk viral types.  We will discuss and base management after colpo results return.       Prentice Docker, MD  [facility], [facility] 09/08/2017  3:41 PM

## 2017-09-12 LAB — PATHOLOGY

## 2017-09-14 ENCOUNTER — Encounter: Payer: Self-pay | Admitting: Obstetrics and Gynecology

## 2017-09-14 ENCOUNTER — Telehealth: Payer: Self-pay | Admitting: Obstetrics and Gynecology

## 2017-09-14 NOTE — Telephone Encounter (Signed)
Patient aware of CIN 1 results and recommendation to f/u in one year for pap.

## 2018-07-16 ENCOUNTER — Other Ambulatory Visit: Payer: Self-pay | Admitting: Obstetrics and Gynecology

## 2018-07-16 ENCOUNTER — Other Ambulatory Visit: Payer: Self-pay

## 2018-07-16 DIAGNOSIS — Z3041 Encounter for surveillance of contraceptive pills: Secondary | ICD-10-CM

## 2018-07-16 MED ORDER — NORGESTIMATE-ETH ESTRADIOL 0.25-35 MG-MCG PO TABS: 1.0000 | ORAL_TABLET | Freq: Every day | ORAL | 2 refills | Status: DC

## 2018-09-11 ENCOUNTER — Ambulatory Visit: Payer: BLUE CROSS/BLUE SHIELD | Admitting: Obstetrics and Gynecology

## 2018-09-21 ENCOUNTER — Ambulatory Visit: Payer: BLUE CROSS/BLUE SHIELD | Admitting: Obstetrics and Gynecology

## 2018-10-04 ENCOUNTER — Ambulatory Visit: Payer: BLUE CROSS/BLUE SHIELD | Admitting: Obstetrics and Gynecology

## 2018-10-16 ENCOUNTER — Other Ambulatory Visit (HOSPITAL_COMMUNITY)
Admission: RE | Admit: 2018-10-16 | Discharge: 2018-10-16 | Disposition: A | Discharge status: Home / Self Care | Payer: BLUE CROSS/BLUE SHIELD | Source: Ambulatory Visit | Attending: Obstetrics and Gynecology | Admitting: Obstetrics and Gynecology

## 2018-10-16 ENCOUNTER — Encounter: Payer: Self-pay | Admitting: Obstetrics and Gynecology

## 2018-10-16 ENCOUNTER — Ambulatory Visit (INDEPENDENT_AMBULATORY_CARE_PROVIDER_SITE_OTHER): Payer: BLUE CROSS/BLUE SHIELD | Admitting: Obstetrics and Gynecology

## 2018-10-16 ENCOUNTER — Telehealth: Payer: Self-pay

## 2018-10-16 VITALS — BP 138/88 | Ht 66.0 in | Wt 290.0 lb

## 2018-10-16 DIAGNOSIS — N920 Excessive and frequent menstruation with regular cycle: Secondary | ICD-10-CM

## 2018-10-16 DIAGNOSIS — Z124 Encounter for screening for malignant neoplasm of cervix: Secondary | ICD-10-CM

## 2018-10-16 DIAGNOSIS — Z1339 Encounter for screening examination for other mental health and behavioral disorders: Secondary | ICD-10-CM

## 2018-10-16 DIAGNOSIS — Z01419 Encounter for gynecological examination (general) (routine) without abnormal findings: Secondary | ICD-10-CM

## 2018-10-16 DIAGNOSIS — Z1331 Encounter for screening for depression: Secondary | ICD-10-CM

## 2018-10-16 DIAGNOSIS — R87612 Low grade squamous intraepithelial lesion on cytologic smear of cervix (LGSIL): Secondary | ICD-10-CM

## 2018-10-16 NOTE — Telephone Encounter (Signed)
Pt forgot to tell SDJ that she is almost out of bcp.  Please send to [facility].  [phone]

## 2018-10-16 NOTE — Progress Notes (Signed)
Gynecology Annual Exam  PCP: Patient, No Pcp Per  Chief Complaint  Patient presents with  . Annual Exam  . Menstrual Problem   History of Present Illness:  [name] is a 38 y.o. G2P2001 who LMP was Patient's last menstrual period was 10/02/2018., presents today for her annual examination.  Her menses are regular every 28-30 days, lasting 6 day(s).  Dysmenorrhea mild, occurring first 1-2 days of flow. She does have intermenstrual bleeding.  Her bleeding has become heavier during her cycles, such that she is passing large clots and she is going through super tampon plus a pad. She changes these every 3-4 hours and they are saturated.  This has been going on for about five months.  She has noted some spotting between periods. She also notes some spotting after intercourse.   She is sexually active with a single partner, contraception - OCP (estrogen/progesterone).  Last Pap: 07/2017  Results were: low-grade squamous intraepithelial neoplasia (LGSIL - encompassing HPV,mild dysplasia,CIN I) /neg HPV DNA not done. 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  Hx of STDs: trichomonas  There is no FH of breast cancer. There is no FH of ovarian cancer. The patient does not do self-breast exams.  Tobacco use: The patient denies current or previous tobacco use. Alcohol use: social drinker Exercise: not active  The patient wears seatbelts: yes.   The patient reports that domestic violence in her life is absent.   She does not think she might be pregnant or have an STD.  Past Medical History:  Diagnosis Date  . Abnormal Pap smear of cervix   . Anemia     Past Surgical History:  Procedure Laterality Date  . CESAREAN SECTION    . CESAREAN SECTION    . DILATATION & CURETTAGE/HYSTEROSCOPY WITH MYOSURE N/A 11/06/2015   Procedure: DILATATION & CURETTAGE WITH MY SURE;  Surgeon: Will Bonnet, MD;  Location: [facility] ORS;  Service: Gynecology;  Laterality: N/A;  . HYSTEROSCOPY N/A 11/06/2015   Procedure:  HYSTEROSCOPY WITH REMOVAL OF CERVICAL MASS;  Surgeon: Will Bonnet, MD;  Location: [facility] ORS;  Service: Gynecology;  Laterality: N/A;  . MYOMECTOMY    . TONSILLECTOMY     age7    Prior to Admission medications   Medication Sig Start Date End Date Taking? Authorizing Provider  Ferrous Sulfate (IRON) 325 (65 Fe) MG TABS Take 1 tablet by mouth daily.   Yes [provider]  norgestimate-ethinyl estradiol (Nora 28) 0.25-35 MG-MCG tablet Take 1 tablet by mouth daily. 07/16/18  Yes Will Bonnet, MD   Allergies: No Known Allergies  Obstetric History: [id number], s/p c-section x 2  Social History   Socioeconomic History  . Marital status: Married    Spouse name: Not on file  . Number of children: Not on file  . Years of education: Not on file  . Highest education level: Not on file  Occupational History  . Not on file  Social Needs  . Financial resource strain: Not on file  . Food insecurity:    Worry: Not on file    Inability: Not on file  . Transportation needs:    Medical: Not on file    Non-medical: Not on file  Tobacco Use  . Smoking status: Never Smoker  . Smokeless tobacco: Never Used  . Tobacco comment: passive smoke outside of home  Substance and Sexual Activity  . Alcohol use: Yes    Comment: occasionaly  . Drug use: No  . Sexual activity: Yes  Birth control/protection: Pill  Lifestyle  . Physical activity:    Days per week: Not on file    Minutes per session: Not on file  . Stress: Not on file  Relationships  . Social connections:    Talks on phone: Not on file    Gets together: Not on file    Attends religious service: Not on file    Active member of club or organization: Not on file    Attends meetings of clubs or organizations: Not on file    Relationship status: Not on file  . Intimate partner violence:    Fear of current or ex partner: Not on file    Emotionally abused: Not on file    Physically abused: Not on file    Forced  sexual activity: Not on file  Other Topics Concern  . Not on file  Social History Narrative   ** Merged History Encounter **        Family History  Problem Relation Age of Onset  . Seizures Mother   . Diabetes Mellitus II Maternal Grandmother     Review of Systems  Constitutional: Negative.   HENT: Negative.   Eyes: Negative.   Respiratory: Negative.   Cardiovascular: Negative.   Gastrointestinal: Negative.   Genitourinary: Negative.   Musculoskeletal: Negative.   Skin: Negative.   Neurological: Negative.   Psychiatric/Behavioral: Negative.      Physical Exam BP 138/88   Ht 5\' 6"  (1.676 m)   Wt 290 lb (131.5 kg)   LMP 10/02/2018   BMI 46.81 kg/m    Physical Exam Constitutional:      General: She is not in acute distress.    Appearance: Normal appearance. She is well-developed.  Genitourinary:     Pelvic exam was performed with patient supine.     Vulva, inguinal canal, urethra, bladder and uterus normal.     No inguinal adenopathy present in the right or left side.    No signs of injury in the vagina.     No vaginal discharge, erythema, tenderness or bleeding.     No cervical motion tenderness, discharge, lesion or polyp.     Uterus is mobile.     Uterus is not enlarged or tender.     No uterine mass detected.    Uterus is anteverted.     No right or left adnexal mass present.     Right adnexa not tender or full.     Left adnexa not tender or full.     Genitourinary Comments: Exam limited by patient's body habitus  HENT:     Head: Normocephalic and atraumatic.  Eyes:     General: No scleral icterus.    Conjunctiva/sclera: Conjunctivae normal.  Neck:     Musculoskeletal: Normal range of motion and neck supple.     Thyroid: No thyromegaly.  Cardiovascular:     Rate and Rhythm: Normal rate and regular rhythm.     Heart sounds: No murmur. No friction rub. No gallop.   Pulmonary:     Effort: Pulmonary effort is normal. No respiratory distress.     Breath  sounds: Normal breath sounds. No wheezing or rales.  Chest:     Breasts:        Right: No inverted nipple, mass, nipple discharge, skin change or tenderness.        Left: No inverted nipple, mass, nipple discharge, skin change or tenderness.  Abdominal:     General: Bowel sounds are normal. There  is no distension.     Palpations: Abdomen is soft. There is no mass.     Tenderness: There is no abdominal tenderness. There is no guarding or rebound.  Musculoskeletal: Normal range of motion.        General: No swelling or tenderness.  Lymphadenopathy:     Cervical: No cervical adenopathy.     Lower Body: No right inguinal adenopathy. No left inguinal adenopathy.  Neurological:     General: No focal deficit present.     Mental Status: She is alert and oriented to person, place, and time.     Cranial Nerves: No cranial nerve deficit.  Skin:    General: Skin is warm and dry.     Findings: No erythema or rash.  Psychiatric:        Mood and Affect: Mood normal.        Behavior: Behavior normal.        Judgment: Judgment normal.   Female chaperone present for pelvic and breast  portions of the physical exam  Results: AUDIT Questionnaire (screen for alcoholism): 0 PHQ-9: 0   Assessment: 38 y.o. G60P2001 female here for routine annual gynecologic examination  Plan: Problem List Items Addressed This Visit      Other   LGSIL on Pap smear of cervix   Relevant Orders   Cytology - PAP    Other Visit Diagnoses    Women's annual routine gynecological examination    -  Primary   Relevant Orders   US PELVIS TRANSVANGINAL NON-OB (TV ONLY)   Cytology - PAP   Screening for depression       Screening for alcoholism       Pap smear for cervical cancer screening       Relevant Orders   Cytology - PAP   Menorrhagia with regular cycle       Relevant Orders   US PELVIS TRANSVANGINAL NON-OB (TV ONLY)   Cytology - PAP      Screening: -- Blood pressure screen normal -- Weight screening:  obese: discussed management options, including lifestyle, dietary, and exercise. -- Depression screening negative (PHQ-9) -- Nutrition: normal -- cholesterol screening: not due for screening -- osteoporosis screening: not due -- tobacco screening: not using -- alcohol screening: AUDIT questionnaire indicates low-risk usage. -- family history of breast cancer screening: done. not at high risk. -- no evidence of domestic violence or intimate partner violence. -- STD screening: gonorrhea/chlamydia NAAT not collected per patient request. -- pap smear collected per ASCCP guidelines -- flu vaccine recevied at work -- HPV vaccination series: not eligilbe    Menorrhagia with regular cycle: Discussed management options for abnormal uterine bleeding including expectant, NSAIDs, tranexamic acid (Lysteda), oral progesterone (Provera, norethindrone, megace), Depo Provera, Levonorgestrel containing IUD, endometrial ablation (Novasure) or hysterectomy as definitive surgical management.  Discussed risks and benefits of each method.   Final management decision will hinge on results of patient's work up and whether an underlying etiology for the patients bleeding symptoms can be discerned.  We will conduct a basic work up examining using the PALM-COIEN classification system.  In the meantime the patient opts to trial nothing while we await results of her ultrasound and labs.  Printed patient education handouts were given to the patient to review at home.  Bleeding precautions reviewed.   Prentice Docker, MD 10/16/2018 5:38 PM

## 2018-10-17 ENCOUNTER — Other Ambulatory Visit: Payer: Self-pay

## 2018-10-17 DIAGNOSIS — Z309 Encounter for contraceptive management, unspecified: Secondary | ICD-10-CM

## 2018-10-17 DIAGNOSIS — Z3041 Encounter for surveillance of contraceptive pills: Secondary | ICD-10-CM

## 2018-10-17 MED ORDER — NORGESTIMATE-ETH ESTRADIOL 0.25-35 MG-MCG PO TABS: 1.0000 | ORAL_TABLET | Freq: Every day | ORAL | 11 refills | Status: DC

## 2018-10-17 NOTE — Telephone Encounter (Signed)
rx sent to last to next annual

## 2018-10-18 ENCOUNTER — Encounter: Payer: Self-pay | Admitting: Obstetrics and Gynecology

## 2018-10-18 LAB — CYTOLOGY - PAP
Diagnosis: NEGATIVE
HPV (WINDOPATH): NOT DETECTED

## 2018-11-15 ENCOUNTER — Other Ambulatory Visit: Payer: BLUE CROSS/BLUE SHIELD

## 2018-11-15 ENCOUNTER — Ambulatory Visit: Payer: BLUE CROSS/BLUE SHIELD | Admitting: Obstetrics and Gynecology

## 2018-11-28 ENCOUNTER — Ambulatory Visit: Payer: BLUE CROSS/BLUE SHIELD | Admitting: Obstetrics and Gynecology

## 2018-11-28 ENCOUNTER — Ambulatory Visit: Payer: BLUE CROSS/BLUE SHIELD

## 2018-12-06 ENCOUNTER — Ambulatory Visit: Payer: BLUE CROSS/BLUE SHIELD | Admitting: Obstetrics and Gynecology

## 2018-12-06 ENCOUNTER — Ambulatory Visit: Payer: BLUE CROSS/BLUE SHIELD

## 2018-12-07 ENCOUNTER — Ambulatory Visit (INDEPENDENT_AMBULATORY_CARE_PROVIDER_SITE_OTHER): Payer: BLUE CROSS/BLUE SHIELD

## 2018-12-07 ENCOUNTER — Other Ambulatory Visit: Payer: Self-pay

## 2018-12-07 ENCOUNTER — Ambulatory Visit (INDEPENDENT_AMBULATORY_CARE_PROVIDER_SITE_OTHER): Payer: BLUE CROSS/BLUE SHIELD | Admitting: Obstetrics and Gynecology

## 2018-12-07 ENCOUNTER — Encounter: Payer: Self-pay | Admitting: Obstetrics and Gynecology

## 2018-12-07 VITALS — BP 162/90 | HR 94 | Ht 66.0 in | Wt 299.0 lb

## 2018-12-07 DIAGNOSIS — D250 Submucous leiomyoma of uterus: Secondary | ICD-10-CM | POA: Diagnosis not present

## 2018-12-07 DIAGNOSIS — N9489 Other specified conditions associated with female genital organs and menstrual cycle: Secondary | ICD-10-CM

## 2018-12-07 DIAGNOSIS — N920 Excessive and frequent menstruation with regular cycle: Secondary | ICD-10-CM

## 2018-12-07 DIAGNOSIS — D252 Subserosal leiomyoma of uterus: Secondary | ICD-10-CM | POA: Diagnosis not present

## 2018-12-07 NOTE — Progress Notes (Signed)
Gynecology Ultrasound Follow Up   Chief Complaint  Patient presents with  . Follow-up    GYN scan for AUB    History of Present Illness: Patient is a 38 y.o. female who presents today for ultrasound evaluation of the above .  Ultrasound demonstrates the following findings Adnexa: no masses seen  Uterus: anteverted with endometrial stripe  18.0 mm Additional: solid mass with blood flow appears to be within the endometrial canal.  Possible fibroid versus other etiology (?polyp).  She has a history of a large endocervical polyp.   Past Medical History:  Diagnosis Date  . Abnormal Pap smear of cervix   . Anemia     Past Surgical History:  Procedure Laterality Date  . CESAREAN SECTION    . CESAREAN SECTION    . DILATATION & CURETTAGE/HYSTEROSCOPY WITH MYOSURE N/A 11/06/2015   Procedure: DILATATION & CURETTAGE WITH MY SURE;  Surgeon: Will Bonnet, MD;  Location: [facility] ORS;  Service: Gynecology;  Laterality: N/A;  . HYSTEROSCOPY N/A 11/06/2015   Procedure: HYSTEROSCOPY WITH REMOVAL OF CERVICAL MASS;  Surgeon: Will Bonnet, MD;  Location: [facility] ORS;  Service: Gynecology;  Laterality: N/A;  . MYOMECTOMY    . TONSILLECTOMY     age7    Family History  Problem Relation Age of Onset  . Seizures Mother   . Diabetes Mellitus II Maternal Grandmother     Social History   Socioeconomic History  . Marital status: Married    Spouse name: Not on file  . Number of children: Not on file  . Years of education: Not on file  . Highest education level: Not on file  Occupational History  . Not on file  Social Needs  . Financial resource strain: Not on file  . Food insecurity:    Worry: Not on file    Inability: Not on file  . Transportation needs:    Medical: Not on file    Non-medical: Not on file  Tobacco Use  . Smoking status: Never Smoker  . Smokeless tobacco: Never Used  . Tobacco comment: passive smoke outside of home  Substance and Sexual Activity  . Alcohol use:  Yes    Comment: occasionaly  . Drug use: No  . Sexual activity: Yes    Birth control/protection: Pill  Lifestyle  . Physical activity:    Days per week: Not on file    Minutes per session: Not on file  . Stress: Not on file  Relationships  . Social connections:    Talks on phone: Not on file    Gets together: Not on file    Attends religious service: Not on file    Active member of club or organization: Not on file    Attends meetings of clubs or organizations: Not on file    Relationship status: Not on file  . Intimate partner violence:    Fear of current or ex partner: Not on file    Emotionally abused: Not on file    Physically abused: Not on file    Forced sexual activity: Not on file  Other Topics Concern  . Not on file  Social History Narrative   ** Merged History Encounter **        No Known Allergies  Prior to Admission medications   Medication Sig Start Date End Date Taking? Authorizing Provider  Ferrous Sulfate (IRON) 325 (65 Fe) MG TABS Take 1 tablet by mouth daily.   Yes [provider]  ibuprofen (  ADVIL,MOTRIN) 600 MG tablet Take 1 tablet (600 mg total) by mouth every 6 (six) hours as needed for mild pain or cramping. 11/06/15  Yes Will Bonnet, MD  norgestimate-ethinyl estradiol ([place] 28) 0.25-35 MG-MCG tablet Take 1 tablet by mouth daily. 07/16/18  Yes Will Bonnet, MD  norgestimate-ethinyl estradiol ([place] 28) 0.25-35 MG-MCG tablet Take 1 tablet by mouth daily. 10/17/18  Yes Will Bonnet, MD  HYDROcodone-acetaminophen (NORCO) 5-325 MG tablet Take 1 tablet by mouth every 6 (six) hours as needed for moderate pain. Patient not taking: Reported on 10/16/2018 11/06/15   Will Bonnet, MD    Physical Exam BP (!) 162/90 (BP Location: Left Arm, Patient Position: Sitting, Cuff Size: Large)   Pulse 94   Ht 5\' 6"  (1.676 m)   Wt 299 lb (135.6 kg)   LMP 11/27/2018   BMI 48.26 kg/m    General: NAD HEENT: normocephalic,  anicteric Pulmonary: No increased work of breathing Extremities: no edema, erythema, or tenderness Neurologic: Grossly intact, normal gait Psychiatric: mood appropriate, affect full  Imaging Results US Pelvis Transvanginal Non-ob (tv Only)  Result Date: 12/07/2018 Patient Name: Paige Perez DOB: 1981-01-10 MRN: [id number] ULTRASOUND REPORT Location: [facility] Date of Service: 12/07/2018 Indications: Mennorrhagia Findings: The uterus is anteverted and measures 9.6 x 5.1 x 5.1cm. Echo texture is homogenous with evidence of focal masses. Within the uterus are multiple suspected fibroids measuring: Fibroid 1:  2.0 x 1.9 x 1.6cm (Submcusal) Fibroid 2:  2.1 x 2.0 x 1.6cm (Left, Subserosal) The Endometrium measures 18.0 mm. Solid mass with blood flow appears to be within the endometrial canal. Possible fibroid vs other (measurements under fibroid #1). Right Ovary measures 2.9 x 2.3 x 1.7 cm. It is normal in appearance. Left Ovary measures 2.6 x 1.6 x 1.7 cm. It is normal in appearance. Survey of the adnexa demonstrates no adnexal masses. There is no free fluid in the cul de sac. Impression: 1. Within the endometrial canal there is a solid mass with blood flow measuring 2.0cm.  2. Left subserosal fibroid as described above. Recommendations: 1.Clinical correlation with the patient's History and Physical Exam. 2. Pathological assessment of endometrial mass, as clinically indicated. Vita Barley, RDMS RVT The ultrasound images and findings were reviewed by me and I agree with the above report. Prentice Docker, MD, [facility], [facility] 12/07/2018 2:14 PM       Assessment: 38 y.o. G2P2001  1. Menorrhagia with regular cycle   2. Endometrial mass      Plan: Problem List Items Addressed This Visit    None    Visit Diagnoses    Menorrhagia with regular cycle    -  Primary   Endometrial mass         Discussed findings on ultrasound along with clinical scenario. She has a  history of a protruding fibroid from her cervix, removed 3 years ago.  After much discussion, will schedule for hysteroscoyp, dilation and curettage, removal of endometrial mass.  20 minutes spent in face to face discussion with > 50% spent in counseling,management, and coordination of care of her menorrhagia with regular cycle and endometrial mass.   Prentice Docker, MD, [facility], [facility] 12/07/2018 2:29 PM

## 2018-12-10 ENCOUNTER — Telehealth: Payer: Self-pay | Admitting: Obstetrics and Gynecology

## 2018-12-10 NOTE — Telephone Encounter (Signed)
Patient is aware of H&P at [facility] on Friday, 01/18/19 @ 3:50pm w/ Dr. Glennon Mac, Pre-admit Testing phone interview to be scheduled, and OR on 01/22/19. Patient is aware she may receive calls from the [facility]. Patient confirmed BCBS and no secondary insurance. Patient said her husband will drive her on the day of surgery.

## 2018-12-10 NOTE — Telephone Encounter (Signed)
-----   Message from Will Bonnet, MD sent at 12/07/2018  2:26 PM EST ----- Regarding: Schedule surgery Surgery Booking Request Patient Full Name:  Paige Perez  MRN: [id number]  DOB: 26-Jun-1981  Surgeon: Prentice Docker, MD  Requested Surgery Date and Time: TBD Primary Diagnosis AND Code: menorrhagia with regular cycle, endometrial mass Secondary Diagnosis and Code:  Surgical Procedure: hysteroscopy, dilation and curettage, removal of endometrial mass L&D Notification: No Admission Status: same day surgery Length of Surgery: 45 minutes Special Case Needs: MyoSure hysteroscope H&P: TBD (date) Phone Interview???: yes Interpreter: Language:  Medical Clearance: no Special Scheduling Instructions: none

## 2019-01-02 ENCOUNTER — Telehealth: Payer: Self-pay | Admitting: Obstetrics and Gynecology

## 2019-01-02 NOTE — Telephone Encounter (Signed)
No answer , vm is full

## 2019-01-04 NOTE — Telephone Encounter (Signed)
No answer , vm is full

## 2019-01-18 ENCOUNTER — Encounter: Payer: BLUE CROSS/BLUE SHIELD | Admitting: Obstetrics and Gynecology

## 2019-01-21 ENCOUNTER — Other Ambulatory Visit: Payer: BLUE CROSS/BLUE SHIELD

## 2019-01-22 ENCOUNTER — Encounter: Admission: RE | Payer: Self-pay | Source: Ambulatory Visit

## 2019-01-22 ENCOUNTER — Ambulatory Visit
Admission: RE | Admit: 2019-01-22 | Payer: BLUE CROSS/BLUE SHIELD | Source: Ambulatory Visit | Admitting: Obstetrics and Gynecology

## 2019-01-22 SURGERY — DILATATION AND CURETTAGE /HYSTEROSCOPY
Anesthesia: Choice

## 2019-02-12 ENCOUNTER — Telehealth: Payer: Self-pay | Admitting: Obstetrics and Gynecology

## 2019-02-12 NOTE — Telephone Encounter (Signed)
I attempted to reach the patient to reschedule surgery. No answer, v/m full.

## 2019-02-15 NOTE — Telephone Encounter (Signed)
Patient is aware of H&P at [facility] on 03/12/19 @ 8:10am w/ Dr. Glennon Mac, Pre-admit Testing and COVID testing to be scheduled, and OR on 03/21/19. Patient is aware she may be asked to quarantine after COVID testing. Patient is aware she may receive calls from the [facility] and [facility]. Patient confirmed BCBS and no secondary insurance. Patient is aware of check-in/ mask/ no visitors for [facility]

## 2019-03-12 ENCOUNTER — Other Ambulatory Visit: Payer: Self-pay

## 2019-03-12 ENCOUNTER — Ambulatory Visit (INDEPENDENT_AMBULATORY_CARE_PROVIDER_SITE_OTHER): Payer: BC Managed Care – PPO | Admitting: Obstetrics and Gynecology

## 2019-03-12 ENCOUNTER — Encounter: Payer: Self-pay | Admitting: Obstetrics and Gynecology

## 2019-03-12 VITALS — BP 138/96 | Ht 66.0 in | Wt 298.0 lb

## 2019-03-12 DIAGNOSIS — N920 Excessive and frequent menstruation with regular cycle: Secondary | ICD-10-CM

## 2019-03-12 DIAGNOSIS — N9489 Other specified conditions associated with female genital organs and menstrual cycle: Secondary | ICD-10-CM

## 2019-03-12 NOTE — Progress Notes (Signed)
Preoperative History and Physical  Paige Perez is a 38 y.o. G2P2001 here for surgical management of menorrhagia with regular cycle and endometrial mass.   No significant preoperative concerns.  History of Present Illness: 38 y.o. G37P2002 female with menses that are regular every 28-30 days, lasting 6 day(s).  Dysmenorrhea mild, occurring first 1-2 days of flow. She does have intermenstrual bleeding.  Her bleeding has become heavier during her cycles, such that she is passing large clots and she is going through super tampon plus a pad. She changes these every 3-4 hours and they are saturated.  This has been going on for about five months.  She has noted some spotting between periods. She also notes some spotting after intercourse.   Ultrasound demonstrates the following findings Adnexa: no masses seen  Uterus: anteverted with endometrial stripe  18.0 mm Additional: solid mass with blood flow appears to be within the endometrial canal.  Possible fibroid versus other etiology (?polyp).  She has a history of a large endocervical polyp.   Pap smear: NILM, HPV negative   Proposed surgery: Hysteroscopy, dilation and curettage, removal of endometrial mass  Past Medical History:  Diagnosis Date  . Abnormal Pap smear of cervix   . Anemia    Past Surgical History:  Procedure Laterality Date  . CESAREAN SECTION    . CESAREAN SECTION    . DILATATION & CURETTAGE/HYSTEROSCOPY WITH MYOSURE N/A 11/06/2015   Procedure: DILATATION & CURETTAGE WITH MY SURE;  Surgeon: Will Bonnet, MD;  Location: [facility] ORS;  Service: Gynecology;  Laterality: N/A;  . HYSTEROSCOPY N/A 11/06/2015   Procedure: HYSTEROSCOPY WITH REMOVAL OF CERVICAL MASS;  Surgeon: Will Bonnet, MD;  Location: [facility] ORS;  Service: Gynecology;  Laterality: N/A;  . MYOMECTOMY    . TONSILLECTOMY     age7   OB History  Gravida Para Term Preterm AB Living  2 2 2  0 0 1  SAB TAB Ectopic Multiple Live Births  0 0 0   1    # Outcome  Date GA Lbr Len/2nd Weight Sex Delivery Anes PTL Lv  2 Term      CS-LTranv   LIV  1 Term           Patient denies any other pertinent gynecologic issues.   Current Outpatient Medications on File Prior to Visit  Medication Sig Dispense Refill  . acetaminophen (TYLENOL) 500 MG tablet Take 500 mg by mouth every 6 (six) hours as needed for moderate pain or headache.    . Ferrous Sulfate (IRON) 325 (65 Fe) MG TABS Take 325 mg by mouth daily.     Marland Kitchen HYDROcodone-acetaminophen (NORCO) 5-325 MG tablet Take 1 tablet by mouth every 6 (six) hours as needed for moderate pain. (Patient not taking: Reported on 10/16/2018) 30 tablet 0  . ibuprofen (ADVIL,MOTRIN) 600 MG tablet Take 1 tablet (600 mg total) by mouth every 6 (six) hours as needed for mild pain or cramping. (Patient not taking: Reported on 03/11/2019) 30 tablet 0  . norgestimate-ethinyl estradiol ([place] 28) 0.25-35 MG-MCG tablet Take 1 tablet by mouth daily. (Patient not taking: Reported on 03/11/2019) 28 tablet 2  . norgestimate-ethinyl estradiol (SPRINTEC 28) 0.25-35 MG-MCG tablet Take 1 tablet by mouth daily. 1 Package 11   No current facility-administered medications on file prior to visit.    No Known Allergies  Social History:   reports that she has never smoked. She has never used smokeless tobacco. She reports current alcohol use. She reports that  she does not use drugs.  Family History  Problem Relation Age of Onset  . Seizures Mother   . Diabetes Mellitus II Maternal Grandmother     Review of Systems: Noncontributory  PHYSICAL EXAM: Blood pressure (!) 138/96, height 5\' 6"  (1.676 m), weight 298 lb (135.2 kg). CONSTITUTIONAL: Well-developed, well-nourished female in no acute distress.  HENT:  Normocephalic, atraumatic, External right and left ear normal. Oropharynx is clear and moist EYES: Conjunctivae and EOM are normal. Pupils are equal, round, and reactive to light. No scleral icterus.  NECK: Normal range of motion, supple, no  masses SKIN: Skin is warm and dry. No rash noted. Not diaphoretic. No erythema. No pallor. [place]: Alert and oriented to person, place, and time. Normal reflexes, muscle tone coordination. No cranial nerve deficit noted. PSYCHIATRIC: Normal mood and affect. Normal behavior. Normal judgment and thought content. CARDIOVASCULAR: Normal heart rate noted, regular rhythm RESPIRATORY: Effort and breath sounds normal, no problems with respiration noted ABDOMEN: Soft, nontender, nondistended. PELVIC: Deferred MUSCULOSKELETAL: Normal range of motion. No edema and no tenderness. 2+ distal pulses.  Labs: No results found for this or any previous visit (from the past 336 hour(s)).  Imaging Studies: No results found.  Assessment: 1. Menorrhagia with regular cycle   2. Endometrial mass      Plan: Patient will undergo surgical management with Hysteroscopy, dilation and curettage, removal of endometrial mass.   The risks of surgery were discussed in detail with the patient including but not limited to: bleeding which may require transfusion or reoperation; infection which may require antibiotics; injury to surrounding organs which may involve bowel, bladder, ureters ; need for additional procedures including laparoscopy or laparotomy; thromboembolic phenomenon, surgical site problems and other postoperative/anesthesia complications. Likelihood of success in alleviating the patient's condition was discussed. Routine postoperative instructions will be reviewed with the patient and her family in detail after surgery.  The patient concurred with the proposed plan, giving informed written consent for the surgery.  Preoperative prophylactic antibiotics, as indicated, and SCDs ordered on call to the OR.    Prentice Docker, MD 03/12/2019 8:32 AM

## 2019-03-12 NOTE — H&P (View-Only) (Signed)
Preoperative History and Physical  Paige Perez is a 38 y.o. G2P2001 here for surgical management of menorrhagia with regular cycle and endometrial mass.   No significant preoperative concerns.  History of Present Illness: 38 y.o. G23P2002 female with menses that are regular every 28-30 days, lasting 6 day(s).  Dysmenorrhea mild, occurring first 1-2 days of flow. She does have intermenstrual bleeding.  Her bleeding has become heavier during her cycles, such that she is passing large clots and she is going through super tampon plus a pad. She changes these every 3-4 hours and they are saturated.  This has been going on for about five months.  She has noted some spotting between periods. She also notes some spotting after intercourse.   Ultrasound demonstrates the following findings Adnexa: no masses seen  Uterus: anteverted with endometrial stripe  18.0 mm Additional: solid mass with blood flow appears to be within the endometrial canal.  Possible fibroid versus other etiology (?polyp).  She has a history of a large endocervical polyp.   Pap smear: NILM, HPV negative   Proposed surgery: Hysteroscopy, dilation and curettage, removal of endometrial mass  Past Medical History:  Diagnosis Date  . Abnormal Pap smear of cervix   . Anemia    Past Surgical History:  Procedure Laterality Date  . CESAREAN SECTION    . CESAREAN SECTION    . DILATATION & CURETTAGE/HYSTEROSCOPY WITH MYOSURE N/A 11/06/2015   Procedure: DILATATION & CURETTAGE WITH MY SURE;  Surgeon: Will Bonnet, MD;  Location: [facility] ORS;  Service: Gynecology;  Laterality: N/A;  . HYSTEROSCOPY N/A 11/06/2015   Procedure: HYSTEROSCOPY WITH REMOVAL OF CERVICAL MASS;  Surgeon: Will Bonnet, MD;  Location: [facility] ORS;  Service: Gynecology;  Laterality: N/A;  . MYOMECTOMY    . TONSILLECTOMY     age7   OB History  Gravida Para Term Preterm AB Living  2 2 2  0 0 1  SAB TAB Ectopic Multiple Live Births  0 0 0   1    # Outcome  Date GA Lbr Len/2nd Weight Sex Delivery Anes PTL Lv  2 Term      CS-LTranv   LIV  1 Term           Patient denies any other pertinent gynecologic issues.   Current Outpatient Medications on File Prior to Visit  Medication Sig Dispense Refill  . acetaminophen (TYLENOL) 500 MG tablet Take 500 mg by mouth every 6 (six) hours as needed for moderate pain or headache.    . Ferrous Sulfate (IRON) 325 (65 Fe) MG TABS Take 325 mg by mouth daily.     Marland Kitchen HYDROcodone-acetaminophen (NORCO) 5-325 MG tablet Take 1 tablet by mouth every 6 (six) hours as needed for moderate pain. (Patient not taking: Reported on 10/16/2018) 30 tablet 0  . ibuprofen (ADVIL,MOTRIN) 600 MG tablet Take 1 tablet (600 mg total) by mouth every 6 (six) hours as needed for mild pain or cramping. (Patient not taking: Reported on 03/11/2019) 30 tablet 0  . norgestimate-ethinyl estradiol ([place]PRINT[place] 28) 0.25-35 MG-MCG tablet Take 1 tablet by mouth daily. (Patient not taking: Reported on 03/11/2019) 28 tablet 2  . norgestimate-ethinyl estradiol (SPRINT[place] 28) 0.25-35 MG-MCG tablet Take 1 tablet by mouth daily. 1 Package 11   No current facility-administered medications on file prior to visit.    No Known Allergies  Social History:   reports that she has never smoked. She has never used smokeless tobacco. She reports current alcohol use. She reports that  she does not use drugs.  Family History  Problem Relation Age of Onset  . Seizures Mother   . Diabetes Mellitus II Maternal Grandmother     Review of Systems: Noncontributory  PHYSICAL EXAM: Blood pressure (!) 138/96, height 5\' 6"  (1.676 m), weight 298 lb (135.2 kg). CONSTITUTIONAL: Well-developed, well-nourished female in no acute distress.  HENT:  Normocephalic, atraumatic, External right and left ear normal. Oropharynx is clear and moist EYES: Conjunctivae and EOM are normal. Pupils are equal, round, and reactive to light. No scleral icterus.  NECK: Normal range of motion, supple, no  masses SKIN: Skin is warm and dry. No rash noted. Not diaphoretic. No erythema. No pallor. [place]: Alert and oriented to person, place, and time. Normal reflexes, muscle tone coordination. No cranial nerve deficit noted. PSYCHIATRIC: Normal mood and affect. Normal behavior. Normal judgment and thought content. CARDIOVASCULAR: Normal heart rate noted, regular rhythm RESPIRATORY: Effort and breath sounds normal, no problems with respiration noted ABDOMEN: Soft, nontender, nondistended. PELVIC: Deferred MUSCULOSKELETAL: Normal range of motion. No edema and no tenderness. 2+ distal pulses.  Labs: No results found for this or any previous visit (from the past 336 hour(s)).  Imaging Studies: No results found.  Assessment: 1. Menorrhagia with regular cycle   2. Endometrial mass      Plan: Patient will undergo surgical management with Hysteroscopy, dilation and curettage, removal of endometrial mass.   The risks of surgery were discussed in detail with the patient including but not limited to: bleeding which may require transfusion or reoperation; infection which may require antibiotics; injury to surrounding organs which may involve bowel, bladder, ureters ; need for additional procedures including laparoscopy or laparotomy; thromboembolic phenomenon, surgical site problems and other postoperative/anesthesia complications. Likelihood of success in alleviating the patient's condition was discussed. Routine postoperative instructions will be reviewed with the patient and her family in detail after surgery.  The patient concurred with the proposed plan, giving informed written consent for the surgery.  Preoperative prophylactic antibiotics, as indicated, and SCDs ordered on call to the OR.    Prentice Docker, MD 03/12/2019 8:32 AM

## 2019-03-14 ENCOUNTER — Encounter
Admission: RE | Admit: 2019-03-14 | Discharge: 2019-03-14 | Disposition: A | Discharge status: Home / Self Care | Payer: Self-pay | Source: Ambulatory Visit | Attending: Obstetrics and Gynecology | Admitting: Obstetrics and Gynecology

## 2019-03-14 ENCOUNTER — Other Ambulatory Visit: Payer: Self-pay

## 2019-03-14 HISTORY — DX: Abnormal uterine and vaginal bleeding, unspecified: N93.9

## 2019-03-14 HISTORY — DX: Polyp of corpus uteri: N84.0

## 2019-03-14 NOTE — Patient Instructions (Addendum)
Your procedure is scheduled on: 03/21/2019 Thurs Report to Same Day Surgery 2nd floor medical mall [facility] Entrance-take elevator on left to 2nd floor.  Check in with surgery information desk.) To find out your arrival time please call [phone] between 1PM - 3PM on 03/20/2019 Wed  Remember: Instructions that are not followed completely may result in serious medical risk, up to and including death, or upon the discretion of your surgeon and anesthesiologist your surgery may need to be rescheduled.    _x___ 1. Do not eat food after midnight the night before your procedure. You may drink clear liquids up to 2 hours before you are scheduled to arrive at the hospital for your procedure.  Do not drink clear liquids within 2 hours of your scheduled arrival to the hospital.  Clear liquids include  --Water or Apple juice without pulp  --Clear carbohydrate beverage such as ClearFast or Gatorade  --Black Coffee or Clear Tea (No milk, no creamers, do not add anything to                  the coffee or Tea Type 1 and type 2 diabetics should only drink water.   ____Ensure clear carbohydrate drink on the way to the hospital for bariatric patients  _x___Ensure clear carbohydrate drink 3 hours before surgery   No gum chewing or hard candies.     __x__ 2. No Alcohol for 24 hours before or after surgery.   __x__3. No Smoking or e-cigarettes for 24 prior to surgery.  Do not use any chewable tobacco products for at least 6 hour prior to surgery   ____  4. Bring all medications with you on the day of surgery if instructed.    __x__ 5. Notify your doctor if there is any change in your medical condition     (cold, fever, infections).    x___6. On the morning of surgery brush your teeth with toothpaste and water.  You may rinse your mouth with mouth wash if you wish.  Do not swallow any toothpaste or mouthwash.   Do not wear jewelry, make-up, hairpins, clips or nail polish.  Do not wear lotions,  powders, or perfumes. You may wear deodorant.  Do not shave 48 hours prior to surgery. Men may shave face and neck.  Do not bring valuables to the hospital.    [facility] is not responsible for any belongings or valuables.               Contacts, dentures or bridgework may not be worn into surgery.  Leave your suitcase in the car. After surgery it may be brought to your room.  For patients admitted to the hospital, discharge time is determined by your                       treatment team.  _  Patients discharged the day of surgery will not be allowed to drive home.  You will need someone to drive you home and stay with you the night of your procedure.    Please read over the following fact sheets that you were given:   [facility] Preparing for Surgery and or MRSA Information   _x___ Take anti-hypertensive listed below, cardiac, seizure, asthma,     anti-reflux and psychiatric medicines. These include:  1. None  2.  3.  4.  5.  6.  ____Fleets enema or Magnesium Citrate as directed.   ____ Use CHG Soap or  sage wipes as directed on instruction sheet   ____ Use inhalers on the day of surgery and bring to hospital day of surgery  ____ Stop Metformin and Janumet 2 days prior to surgery.    ____ Take 1/2 of usual insulin dose the night before surgery and none on the morning     surgery.   _x___ Follow recommendations from Cardiologist, Pulmonologist or PCP regarding          stopping Aspirin, Coumadin, Plavix ,Eliquis, Effient, or Pradaxa, and Pletal.  X____Stop Anti-inflammatories such as Advil, Aleve, Ibuprofen, Motrin, Naproxen, Naprosyn, Goodies powders or aspirin products. OK to take Tylenol and                          Celebrex.   _x___ Stop supplements until after surgery.  But may continue Vitamin D, Vitamin B,       and multivitamin.   ____ Bring C-Pap to the hospital.

## 2019-03-18 ENCOUNTER — Other Ambulatory Visit: Payer: Self-pay

## 2019-03-18 ENCOUNTER — Other Ambulatory Visit
Admission: RE | Admit: 2019-03-18 | Discharge: 2019-03-18 | Disposition: A | Discharge status: Home / Self Care | Payer: BC Managed Care – PPO | Source: Ambulatory Visit | Attending: Obstetrics and Gynecology | Admitting: Obstetrics and Gynecology

## 2019-03-18 DIAGNOSIS — Z1159 Encounter for screening for other viral diseases: Secondary | ICD-10-CM | POA: Diagnosis not present

## 2019-03-19 LAB — NOVEL CORONAVIRUS, NAA (HOSP ORDER, SEND-OUT TO REF LAB; TAT 18-24 HRS): SARS-CoV-2, NAA: NOT DETECTED

## 2019-03-21 ENCOUNTER — Ambulatory Visit
Admission: RE | Admit: 2019-03-21 | Discharge: 2019-03-21 | Disposition: A | Discharge status: Home / Self Care | Payer: BC Managed Care – PPO | Attending: Obstetrics and Gynecology | Admitting: Obstetrics and Gynecology

## 2019-03-21 ENCOUNTER — Ambulatory Visit: Payer: BC Managed Care – PPO | Admitting: Anesthesiology

## 2019-03-21 ENCOUNTER — Encounter
Admission: RE | Disposition: A | Discharge status: Home / Self Care | Payer: Self-pay | Source: Home / Self Care | Attending: Obstetrics and Gynecology

## 2019-03-21 ENCOUNTER — Encounter: Payer: Self-pay | Admitting: *Deleted

## 2019-03-21 DIAGNOSIS — D259 Leiomyoma of uterus, unspecified: Secondary | ICD-10-CM | POA: Diagnosis not present

## 2019-03-21 DIAGNOSIS — N920 Excessive and frequent menstruation with regular cycle: Secondary | ICD-10-CM | POA: Diagnosis not present

## 2019-03-21 DIAGNOSIS — Z79890 Hormone replacement therapy (postmenopausal): Secondary | ICD-10-CM | POA: Insufficient documentation

## 2019-03-21 DIAGNOSIS — D250 Submucous leiomyoma of uterus: Secondary | ICD-10-CM | POA: Diagnosis present

## 2019-03-21 DIAGNOSIS — N858 Other specified noninflammatory disorders of uterus: Secondary | ICD-10-CM | POA: Insufficient documentation

## 2019-03-21 HISTORY — PX: HYSTEROSCOPY WITH D & C: SHX1775

## 2019-03-21 LAB — POCT PREGNANCY, URINE: Preg Test, Ur: NEGATIVE

## 2019-03-21 SURGERY — DILATATION AND CURETTAGE /HYSTEROSCOPY
Anesthesia: General

## 2019-03-21 MED ORDER — ROCURONIUM BROMIDE 100 MG/10ML IV SOLN
INTRAVENOUS | Status: DC | PRN
  Administered 2019-03-21 (×2): 10 mg via INTRAVENOUS

## 2019-03-21 MED ORDER — IBUPROFEN 600 MG PO TABS: 600.0000 mg | ORAL_TABLET | Freq: Four times a day (QID) | ORAL | 0 refills | Status: DC | PRN

## 2019-03-21 MED ORDER — HYDROCODONE-ACETAMINOPHEN 5-325 MG PO TABS: 1.0000 | ORAL_TABLET | Freq: Four times a day (QID) | ORAL | 0 refills | Status: DC | PRN

## 2019-03-21 MED ORDER — SUCCINYLCHOLINE CHLORIDE 20 MG/ML IJ SOLN
INTRAMUSCULAR | Status: DC | PRN
  Administered 2019-03-21: 200 mg via INTRAVENOUS

## 2019-03-21 MED ORDER — PROPOFOL 10 MG/ML IV BOLUS
INTRAVENOUS | Status: AC
  Filled 2019-03-21: qty 20

## 2019-03-21 MED ORDER — PROMETHAZINE HCL 25 MG/ML IJ SOLN
INTRAMUSCULAR | Status: AC
  Filled 2019-03-21: qty 1

## 2019-03-21 MED ORDER — DEXAMETHASONE SODIUM PHOSPHATE 10 MG/ML IJ SOLN
INTRAMUSCULAR | Status: DC | PRN
  Administered 2019-03-21: 4 mg via INTRAVENOUS

## 2019-03-21 MED ORDER — LACTATED RINGERS IV SOLN
INTRAVENOUS | Status: DC
  Administered 2019-03-21: 07:00:00 via INTRAVENOUS

## 2019-03-21 MED ORDER — SODIUM CHLORIDE (PF) 0.9 % IJ SOLN
INTRAMUSCULAR | Status: AC
  Filled 2019-03-21: qty 10

## 2019-03-21 MED ORDER — FAMOTIDINE 20 MG PO TABS
20.0000 mg | ORAL_TABLET | Freq: Once | ORAL | Status: AC
  Administered 2019-03-21: 20 mg via ORAL

## 2019-03-21 MED ORDER — SUGAMMADEX SODIUM 500 MG/5ML IV SOLN
INTRAVENOUS | Status: DC | PRN
  Administered 2019-03-21 (×2): 200 mg via INTRAVENOUS

## 2019-03-21 MED ORDER — PROMETHAZINE HCL 25 MG/ML IJ SOLN: 6.2500 mg | Freq: Once | INTRAMUSCULAR | Status: DC

## 2019-03-21 MED ORDER — ONDANSETRON HCL 4 MG/2ML IJ SOLN
INTRAMUSCULAR | Status: DC | PRN
  Administered 2019-03-21: 4 mg via INTRAVENOUS

## 2019-03-21 MED ORDER — LACTATED RINGERS IV SOLN: INTRAVENOUS | Status: DC

## 2019-03-21 MED ORDER — PROPOFOL 10 MG/ML IV BOLUS
INTRAVENOUS | Status: DC | PRN
  Administered 2019-03-21: 200 mg via INTRAVENOUS

## 2019-03-21 MED ORDER — MIDAZOLAM HCL 2 MG/2ML IJ SOLN
INTRAMUSCULAR | Status: DC | PRN
  Administered 2019-03-21: 2 mg via INTRAVENOUS

## 2019-03-21 MED ORDER — ROCURONIUM BROMIDE 50 MG/5ML IV SOLN
INTRAVENOUS | Status: AC
  Filled 2019-03-21: qty 1

## 2019-03-21 MED ORDER — PROMETHAZINE HCL 25 MG/ML IJ SOLN
6.2500 mg | Freq: Once | INTRAMUSCULAR | Status: AC
  Administered 2019-03-21: 6.25 mg via INTRAVENOUS

## 2019-03-21 MED ORDER — HYDROMORPHONE HCL 1 MG/ML IJ SOLN
INTRAMUSCULAR | Status: DC | PRN
  Administered 2019-03-21: .4 mg via INTRAVENOUS
  Administered 2019-03-21: .6 mg via INTRAVENOUS

## 2019-03-21 MED ORDER — LIDOCAINE HCL (CARDIAC) PF 100 MG/5ML IV SOSY
PREFILLED_SYRINGE | INTRAVENOUS | Status: DC | PRN
  Administered 2019-03-21: 100 mg via INTRAVENOUS

## 2019-03-21 MED ORDER — LIDOCAINE HCL (PF) 2 % IJ SOLN
INTRAMUSCULAR | Status: AC
  Filled 2019-03-21: qty 10

## 2019-03-21 MED ORDER — MIDAZOLAM HCL 2 MG/2ML IJ SOLN
INTRAMUSCULAR | Status: AC
  Filled 2019-03-21: qty 2

## 2019-03-21 MED ORDER — OXYCODONE HCL 5 MG/5ML PO SOLN: 5.0000 mg | Freq: Once | ORAL | Status: DC | PRN

## 2019-03-21 MED ORDER — FAMOTIDINE 20 MG PO TABS
ORAL_TABLET | ORAL | Status: AC
  Filled 2019-03-21: qty 1

## 2019-03-21 MED ORDER — FENTANYL CITRATE (PF) 100 MCG/2ML IJ SOLN
INTRAMUSCULAR | Status: DC | PRN
  Administered 2019-03-21: 100 ug via INTRAVENOUS

## 2019-03-21 MED ORDER — LACTATED RINGERS IV SOLN
INTRAVENOUS | Status: DC | PRN
  Administered 2019-03-21: 08:00:00 via INTRAVENOUS

## 2019-03-21 MED ORDER — HYDRALAZINE HCL 20 MG/ML IJ SOLN
INTRAMUSCULAR | Status: DC | PRN
  Administered 2019-03-21: 10 mg via INTRAVENOUS

## 2019-03-21 MED ORDER — HYDROMORPHONE HCL 1 MG/ML IJ SOLN
INTRAMUSCULAR | Status: AC
  Filled 2019-03-21: qty 1

## 2019-03-21 MED ORDER — FENTANYL CITRATE (PF) 100 MCG/2ML IJ SOLN: 25.0000 ug | INTRAMUSCULAR | Status: DC | PRN

## 2019-03-21 MED ORDER — SUCCINYLCHOLINE CHLORIDE 20 MG/ML IJ SOLN
INTRAMUSCULAR | Status: AC
  Filled 2019-03-21: qty 1

## 2019-03-21 MED ORDER — ACETAMINOPHEN 10 MG/ML IV SOLN
INTRAVENOUS | Status: DC | PRN
  Administered 2019-03-21: 1000 mg via INTRAVENOUS

## 2019-03-21 MED ORDER — FENTANYL CITRATE (PF) 100 MCG/2ML IJ SOLN
INTRAMUSCULAR | Status: AC
  Filled 2019-03-21: qty 2

## 2019-03-21 MED ORDER — OXYCODONE HCL 5 MG PO TABS: 5.0000 mg | ORAL_TABLET | Freq: Once | ORAL | Status: DC | PRN

## 2019-03-21 SURGICAL SUPPLY — 28 items
BAG URINE DRAINAGE (UROLOGICAL SUPPLIES) IMPLANT
BAG WASTE FLUENT 6L (MISCELLANEOUS) ×2 IMPLANT
CANISTER SUCT 3000ML PPV (MISCELLANEOUS) ×3 IMPLANT
CATH FOLEY 2WAY  5CC 16FR (CATHETERS)
CATH ROBINSON RED A/P 16FR (CATHETERS) ×3 IMPLANT
CATH URTH 16FR FL 2W BLN LF (CATHETERS) IMPLANT
COVER WAND RF STERILE (DRAPES) ×1 IMPLANT
DEVICE MYOSURE REACH (MISCELLANEOUS) ×2 IMPLANT
ELECT REM PT RETURN 9FT ADLT (ELECTROSURGICAL)
ELECT RESECT POWERBALL 24F (MISCELLANEOUS) IMPLANT
ELECTRODE REM PT RTRN 9FT ADLT (ELECTROSURGICAL) ×1 IMPLANT
GLOVE BIO SURGEON STRL SZ7 (GLOVE) ×5 IMPLANT
GLOVE BIOGEL PI IND STRL 7.5 (GLOVE) ×1 IMPLANT
GLOVE BIOGEL PI INDICATOR 7.5 (GLOVE) ×4
GLYCINE 1.5% IRRIG UROMATIC (IV SOLUTION) IMPLANT
GOWN STRL REUS W/ TWL LRG LVL3 (GOWN DISPOSABLE) ×2 IMPLANT
GOWN STRL REUS W/TWL LRG LVL3 (GOWN DISPOSABLE) ×4
HANDLE YANKAUER SUCT BULB TIP (MISCELLANEOUS) ×2 IMPLANT
IV LACTATED RINGERS 1000ML (IV SOLUTION) ×5 IMPLANT
IV NS IRRIG 3000ML ARTHROMATIC (IV SOLUTION) ×8 IMPLANT
KIT PROCEDURE FLUENT (KITS) ×2 IMPLANT
KIT TURNOVER CYSTO (KITS) ×3 IMPLANT
LOOP CUT RT ANGL 28F (MISCELLANEOUS) IMPLANT
PACK DNC HYST (MISCELLANEOUS) ×3 IMPLANT
PAD OB MATERNITY 4.3X12.25 (PERSONAL CARE ITEMS) ×3 IMPLANT
PAD PREP 24X41 OB/GYN DISP (PERSONAL CARE ITEMS) ×3 IMPLANT
TUBING CONNECTING 10 (TUBING) ×3 IMPLANT
TUBING CONNECTING 10' (TUBING) ×2

## 2019-03-21 NOTE — Transfer of Care (Signed)
Immediate Anesthesia Transfer of Care Note  Patient: Paige Perez  Procedure(s) Performed: DILATATION AND CURETTAGE /HYSTEROSCOPY - REMOVAL OF ENDOMETRIAL MASS (N/A )  Patient Location: PACU  Anesthesia Type:General  Level of Consciousness: sedated  Airway & Oxygen Therapy: Patient Spontanous Breathing and Patient connected to face mask oxygen  Post-op Assessment: Report given to RN and Post -op Vital signs reviewed and stable  Post vital signs: Reviewed and stable  Last Vitals:  Vitals Value Taken Time  BP 149/76 03/21/19 1007  Temp 36 C 03/21/19 0952  Pulse 78 03/21/19 1008  Resp 29 03/21/19 1008  SpO2 95 % 03/21/19 1008  Vitals shown include unvalidated device data.  Last Pain:  Vitals:   03/21/19 0952  TempSrc:   PainSc: Asleep         Complications: No apparent anesthesia complications

## 2019-03-21 NOTE — Anesthesia Postprocedure Evaluation (Signed)
Anesthesia Post Note  Patient: Paige Perez  Procedure(s) Performed: DILATATION AND CURETTAGE /HYSTEROSCOPY - REMOVAL OF ENDOMETRIAL MASS (N/A )  Anesthesia Type: General     Last Vitals:  Vitals:   03/21/19 0624 03/21/19 0952  BP: (!) 176/111 (!) 156/78  Pulse: 91 89  Resp: 12 (!) 24  Temp: 36.6 C (!) 36 C  SpO2: 100% 91%    Last Pain:  Vitals:   03/21/19 0952  TempSrc:   PainSc: Asleep                 Sanjuana Kava

## 2019-03-21 NOTE — Discharge Instructions (Signed)

## 2019-03-21 NOTE — Interval H&P Note (Signed)
History and Physical Interval Note:  03/21/2019 7:26 AM  Paige Perez  has presented today for surgery, with the diagnosis of MENORRHAGIA WITH REGULAR CYCLE, ENDOMETRIAL MASS.  The various methods of treatment have been discussed with the patient and family. After consideration of risks, benefits and other options for treatment, the patient has consented to  Procedure(s): DILATATION AND CURETTAGE /HYSTEROSCOPY - REMOVAL OF ENDOMETRIAL MASS (N/A) as a surgical intervention.  The patient's history has been reviewed, patient examined, no change in status, stable for surgery.  I have reviewed the patient's chart and labs.  Questions were answered to the patient's satisfaction.    Prentice Docker, MD, [facility], [facility] 03/21/2019 7:27 AM

## 2019-03-21 NOTE — Anesthesia Post-op Follow-up Note (Signed)
Anesthesia QCDR form completed.        

## 2019-03-21 NOTE — Anesthesia Postprocedure Evaluation (Signed)
Anesthesia Post Note  Patient: Paige Perez  Procedure(s) Performed: DILATATION AND CURETTAGE /HYSTEROSCOPY - REMOVAL OF ENDOMETRIAL MASS (N/A )  Patient location during evaluation: PACU Anesthesia Type: General Level of consciousness: awake and alert Pain management: pain level controlled Vital Signs Assessment: post-procedure vital signs reviewed and stable Respiratory status: spontaneous breathing, nonlabored ventilation, respiratory function stable and patient connected to nasal cannula oxygen Cardiovascular status: blood pressure returned to baseline and stable Postop Assessment: no apparent nausea or vomiting Anesthetic complications: no     Last Vitals:  Vitals:   03/21/19 1059 03/21/19 1155  BP: (!) 146/99 (!) 154/84  Pulse: 85 76  Resp: 20 18  Temp:  36.6 C  SpO2: 95% 98%    Last Pain:  Vitals:   03/21/19 1155  TempSrc: Temporal  PainSc: 0-No pain                 Precious Haws Piscitello

## 2019-03-21 NOTE — Anesthesia Procedure Notes (Signed)
Procedure Name: Intubation Date/Time: 03/21/2019 7:44 AM Performed by: Justus Memory, CRNA Pre-anesthesia Checklist: Patient identified, Patient being monitored, Timeout performed, Emergency Drugs available and Suction available Patient Re-evaluated:Patient Re-evaluated prior to induction Oxygen Delivery Method: Circle system utilized Preoxygenation: Pre-oxygenation with 100% oxygen Induction Type: IV induction Ventilation: Mask ventilation without difficulty Laryngoscope Size: Mac and 3 Grade View: Grade I Tube type: Oral Tube size: 7.0 mm Number of attempts: 1 Airway Equipment and Method: Stylet Placement Confirmation: ETT inserted through vocal cords under direct vision,  positive ETCO2 and breath sounds checked- equal and bilateral Secured at: 21 cm Tube secured with: Tape Dental Injury: Teeth and Oropharynx as per pre-operative assessment

## 2019-03-21 NOTE — Anesthesia Preprocedure Evaluation (Signed)
Anesthesia Evaluation  Patient identified by MRN, date of birth, ID band Patient awake    Reviewed: Allergy & Precautions, H&P , NPO status , Patient's Chart, lab work & pertinent test results  History of Anesthesia Complications Negative for: history of anesthetic complications  Airway Mallampati: II  TM Distance: >3 FB Neck ROM: full    Dental  (+) Chipped   Pulmonary neg pulmonary ROS, neg shortness of breath,           Cardiovascular Exercise Tolerance: Good (-) angina(-) Past MI and (-) DOE negative cardio ROS       Neuro/Psych negative neurological ROS  negative psych ROS   GI/Hepatic negative GI ROS, Neg liver ROS, neg GERD  ,  Endo/Other  negative endocrine ROS  Renal/GU      Musculoskeletal   Abdominal   Peds  Hematology negative hematology ROS (+)   Anesthesia Other Findings Past Medical History: No date: Abnormal Pap smear of cervix No date: Anemia No date: Uterine bleeding No date: Uterine polyp  Past Surgical History: No date: CESAREAN SECTION No date: CESAREAN SECTION 11/06/2015: [place]; N/A     Comment:  Procedure: DILATATION & CURETTAGE WITH MY SURE;                Surgeon: Will Bonnet, MD;  Location: [facility] ORS;                Service: Gynecology;  Laterality: N/A; 11/06/2015: HYSTEROSCOPY; N/A     Comment:  Procedure: HYSTEROSCOPY WITH REMOVAL OF CERVICAL MASS;                Surgeon: Will Bonnet, MD;  Location: [facility] ORS;                Service: Gynecology;  Laterality: N/A; No date: MYOMECTOMY No date: TONSILLECTOMY     Comment:  age7  BMI    Body Mass Index: 47.94 kg/m      Reproductive/Obstetrics negative OB ROS                             Anesthesia Physical Anesthesia Plan  ASA: III  Anesthesia Plan: General ETT   Post-op Pain Management:    Induction: Intravenous  PONV Risk Score and Plan:  Ondansetron, Dexamethasone, Midazolam and Treatment may vary due to age or medical condition  Airway Management Planned: Oral ETT and Video Laryngoscope Planned  Additional Equipment:   Intra-op Plan:   Post-operative Plan: Extubation in OR  Informed Consent: I have reviewed the patients History and Physical, chart, labs and discussed the procedure including the risks, benefits and alternatives for the proposed anesthesia with the patient or authorized representative who has indicated his/her understanding and acceptance.     Dental Advisory Given  Plan Discussed with: Anesthesiologist, CRNA and Surgeon  Anesthesia Plan Comments: (Patient consented for risks of anesthesia including but not limited to:  - adverse reactions to medications - damage to teeth, lips or other oral mucosa - sore throat or hoarseness - Damage to heart, brain, lungs or loss of life  Patient voiced understanding.)        Anesthesia Quick Evaluation

## 2019-03-21 NOTE — OR Nursing (Signed)
Phenergan given as ordered for c/o nausea.

## 2019-03-21 NOTE — OR Nursing (Signed)
Awake from nap, denies nausea.

## 2019-03-21 NOTE — Op Note (Signed)
Operative Note   03/21/2019  PRE-OP DIAGNOSIS:  1) Menorrhagia with regular cycle 2) endometrial mass   POST-OP DIAGNOSIS:  1) Menorrhagia with regular cycle 2) endometrial mass, suspect  fibroid   SURGEON: Surgeon(s) and Role:    Will Bonnet, MD - Primary  PROCEDURE: Procedure(s): DILATATION AND CURETTAGE /HYSTEROSCOPY - REMOVAL OF ENDOMETRIAL MASS   ANESTHESIA: General ET  ESTIMATED BLOOD LOSS: 100 mL  DRAINS: none   TOTAL IV FLUIDS: 700 mL  SPECIMENS:  Fragments of endometrial fibroid(mass)  VTE PROPHYLAXIS: SCDs to the bilateral lower extremities  ANTIBIOTICS: none indicated, none given  FLUID DEFICIT: 622 mL  COMPLICATIONS: none  DISPOSITION: PACU - hemodynamically stable.  CONDITION: stable  INDICATION: 38 y.o. [id number] female with history of removal of lower uterine segment intramural fibroid that was fungating into her vagina. This was in early 2017.  She had been asymptomatic until earlier this year. A pelvic ultrasound showed another possible lower uterine segment fibroid, this time much smaller, but still significant.  FINDINGS: Exam under anesthesia revealed small, mobile uterus with no masses and bilateral adnexa without masses or fullness. Hysteroscopy revealed a grossly normal appearing uterine cavity with a fibroid noted at the lower uterine segment junction withe internal cervical os, with bilateral tubal ostia and normal appearing endocervical canal.  PROCEDURE IN DETAIL:  After informed consent was obtained, the patient was taken to the operating room where anesthesia was obtained without difficulty. The patient was positioned in the dorsal lithotomy position in candy cane stirrups.  The patient's bladder was catheterized with an in and out foley catheter.  The patient was examined under anesthesia, with the above noted findings.  The bi-valved speculum was placed inside the patient's vagina, and the the anterior lip of the cervix was seen and  grasped with the tenaculum.  The cervix was progressively dilated to a #7 Hegar dilator.  The hysteroscope was introduced, with the above noted findings. The MyoSure hystersocope was utilized to remove the fibroid.  Hemostasis was noted once the fibroid was removed.  The hysteroscope was removed and the uterine cavity was curetted until a gritty texture was noted.  Excellent hemostasis was noted, and all instruments were removed, with excellent hemostasis noted throughout.  She was then taken out of dorsal lithotomy.  The patient tolerated the procedure well.  Sponge, lap and needle counts were correct x2.  The patient was taken to recovery room in excellent condition.  Will Bonnet, MD, [facility] 03/21/2019 9:36 AM

## 2019-03-25 LAB — SURGICAL PATHOLOGY

## 2019-04-04 ENCOUNTER — Ambulatory Visit (INDEPENDENT_AMBULATORY_CARE_PROVIDER_SITE_OTHER): Payer: BC Managed Care – PPO | Admitting: Obstetrics and Gynecology

## 2019-04-04 ENCOUNTER — Encounter: Payer: Self-pay | Admitting: Obstetrics and Gynecology

## 2019-04-04 ENCOUNTER — Other Ambulatory Visit: Payer: Self-pay

## 2019-04-04 VITALS — BP 134/76 | Wt 298.0 lb

## 2019-04-04 DIAGNOSIS — Z09 Encounter for follow-up examination after completed treatment for conditions other than malignant neoplasm: Secondary | ICD-10-CM

## 2019-04-04 NOTE — Progress Notes (Signed)
   Postoperative Follow-up Patient presents post op from Hysteroscopy, D&C, myomectomy  2 weeks ago for fibroids.  Subjective: Patient reports marked improvement in her preop symptoms. Eating a regular diet without difficulty. The patient is not having any pain.  Activity: normal activities of daily living.  Objective: Vitals:   04/04/19 1023  BP: 134/76   Vital Signs: BP 134/76   Wt 298 lb (135.2 kg)   BMI 48.10 kg/m  Constitutional: Well nourished, well developed female in no acute distress.  HEENT: normal Skin: Warm and dry.  Extremity: no edema  Abdomen: Soft, non-tender, normal bowel sounds; no bruits, organomegaly or masses.   Pelvic exam: deferred  Assessment: 38 y.o. s/p Hysteroscopy, D&C, myomectomy progressing well  Plan: Patient has done well after surgery with no apparent complications.  I have discussed the post-operative course to date, and the expected progress moving forward.  The patient understands what complications to be concerned about.  I will see the patient in routine follow up, or sooner if needed.    Activity plan: No restriction.  Plan for follow up u/s in 1 year. If regrowth suspected, will consider TLH.  Prentice Docker, MD 04/04/2019, 11:04 AM

## 2019-09-24 ENCOUNTER — Other Ambulatory Visit: Payer: Self-pay | Admitting: Obstetrics and Gynecology

## 2019-09-24 DIAGNOSIS — Z309 Encounter for contraceptive management, unspecified: Secondary | ICD-10-CM

## 2019-10-17 ENCOUNTER — Emergency Department
Admission: EM | Admit: 2019-10-17 | Discharge: 2019-10-17 | Disposition: A | Discharge status: Home / Self Care | Payer: BC Managed Care – PPO | Attending: Emergency Medicine | Admitting: Emergency Medicine

## 2019-10-17 ENCOUNTER — Emergency Department: Payer: BC Managed Care – PPO

## 2019-10-17 DIAGNOSIS — U071 COVID-19: Secondary | ICD-10-CM | POA: Diagnosis not present

## 2019-10-17 DIAGNOSIS — Z7722 Contact with and (suspected) exposure to environmental tobacco smoke (acute) (chronic): Secondary | ICD-10-CM | POA: Insufficient documentation

## 2019-10-17 DIAGNOSIS — J988 Other specified respiratory disorders: Secondary | ICD-10-CM

## 2019-10-17 DIAGNOSIS — R509 Fever, unspecified: Secondary | ICD-10-CM | POA: Diagnosis present

## 2019-10-17 DIAGNOSIS — B9789 Other viral agents as the cause of diseases classified elsewhere: Secondary | ICD-10-CM

## 2019-10-17 DIAGNOSIS — B349 Viral infection, unspecified: Secondary | ICD-10-CM | POA: Diagnosis not present

## 2019-10-17 LAB — URINALYSIS, COMPLETE (UACMP) WITH MICROSCOPIC
Bilirubin Urine: NEGATIVE
Glucose, UA: NEGATIVE mg/dL
Ketones, ur: NEGATIVE mg/dL
Nitrite: NEGATIVE
Protein, ur: 30 mg/dL — AB
Specific Gravity, Urine: 1.014 (ref 1.005–1.030)
pH: 6 (ref 5.0–8.0)

## 2019-10-17 LAB — SARS CORONAVIRUS 2 (TAT 6-24 HRS): SARS Coronavirus 2: POSITIVE — AB

## 2019-10-17 LAB — POCT PREGNANCY, URINE: Preg Test, Ur: NEGATIVE

## 2019-10-17 LAB — POC SARS CORONAVIRUS 2 AG: SARS Coronavirus 2 Ag: NEGATIVE

## 2019-10-17 MED ORDER — BENZONATATE 100 MG PO CAPS: 100.0000 mg | ORAL_CAPSULE | Freq: Three times a day (TID) | ORAL | 0 refills | Status: AC | PRN

## 2019-10-17 MED ORDER — AZITHROMYCIN 500 MG PO TABS: 500.0000 mg | ORAL_TABLET | Freq: Every day | ORAL | 0 refills | Status: AC

## 2019-10-17 MED ORDER — ALBUTEROL SULFATE HFA 108 (90 BASE) MCG/ACT IN AERS: 2.0000 | INHALATION_SPRAY | Freq: Four times a day (QID) | RESPIRATORY_TRACT | 0 refills | Status: DC | PRN

## 2019-10-17 NOTE — ED Provider Notes (Signed)
[facility] Emergency Department Provider Note  ____________________________________________   First MD Initiated Contact with Patient 10/17/19 [phone]     (approximate)  I have reviewed the triage vital signs and the nursing notes.   HISTORY  Chief Complaint No chief complaint on file.    HPI Paige Perez is a 39 y.o. female with below list of previous medical conditions presents to the emergency department secondary to 1 day history of fever generalized body aches cough dyspnea        Past Medical History:  Diagnosis Date  . Abnormal Pap smear of cervix   . Anemia   . Uterine bleeding   . Uterine polyp     Patient Active Problem List   Diagnosis Date Noted  . 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 Morbid obesity ([place]) 07/14/2017  . BMI 40.0-44.9, adult ([place]) 07/14/2017    Past Surgical History:  Procedure Laterality Date  . CESAREAN SECTION    . CESAREAN SECTION    . DILATATION & CURETTAGE/HYSTEROSCOPY WITH MYOSURE N/A 11/06/2015   Procedure: DILATATION & CURETTAGE WITH MY SURE;  Surgeon: Will Bonnet, MD;  Location: [facility] ORS;  Service: Gynecology;  Laterality: N/A;  . HYSTEROSCOPY N/A 11/06/2015   Procedure: HYSTEROSCOPY WITH REMOVAL OF CERVICAL MASS;  Surgeon: Will Bonnet, MD;  Location: [facility] ORS;  Service: Gynecology;  Laterality: N/A;  . HYSTEROSCOPY WITH D & C N/A 03/21/2019   Procedure: DILATATION AND CURETTAGE /HYSTEROSCOPY - REMOVAL OF ENDOMETRIAL MASS;  Surgeon: Will Bonnet, MD;  Location: [facility] ORS;  Service: Gynecology;  Laterality: N/A;  . MYOMECTOMY    . TONSILLECTOMY     age7    Prior to Admission medications   Medication Sig Start Date End Date Taking? Authorizing Provider  albuterol (VENTOLIN HFA) 108 (90 Base) MCG/ACT inhaler Inhale 2 puffs into the lungs every 6 (six) hours as needed for wheezing or shortness of breath. 10/17/19   Gregor Hams, MD    azithromycin (ZITHROMAX) 500 MG tablet Take 1 tablet (500 mg total) by mouth daily for 3 days. Take 1 tablet daily for 3 days. 10/17/19 10/20/19  Gregor Hams, MD  benzonatate (TESSALON PERLES) 100 MG capsule Take 1 capsule (100 mg total) by mouth 3 (three) times daily as needed. 10/17/19 10/16/20  Gregor Hams, MD  Ferrous Sulfate (IRON) 325 (65 Fe) MG TABS Take 325 mg by mouth daily.     [provider]  HYDROcodone-acetaminophen (NORCO) 5-325 MG tablet Take 1 tablet by mouth every 6 (six) hours as needed (breakthrough pain). 03/21/19   Will Bonnet, MD  ibuprofen (ADVIL) 600 MG tablet Take 1 tablet (600 mg total) by mouth every 6 (six) hours as needed for mild pain or cramping. 03/21/19   Will Bonnet, MD  Hooker 28 0.25-35 MG-MCG tablet Take 1 tablet by mouth once daily 09/24/19   Will Bonnet, MD    Allergies Patient has no known allergies.  Family History  Problem Relation Age of Onset  . Seizures Mother   . Diabetes Mellitus II Maternal Grandmother     Social History Social History   Tobacco Use  . Smoking status: Never Smoker  . Smokeless tobacco: Never Used  . Tobacco comment: passive smoke outside of home  Substance Use Topics  . Alcohol use: Yes    Comment: occasionaly  . Drug use: No    Review of Systems Constitutional: Positive  for fever/chills Eyes: No visual changes. ENT: No sore throat. Cardiovascular: Denies chest pain. Respiratory: Positive for cough and dyspnea, Gastrointestinal: No abdominal pain.  No nausea, no vomiting.  No diarrhea.  No constipation. Genitourinary: Negative for dysuria. Musculoskeletal: Negative for neck pain.  Negative for back pain. Integumentary: Negative for rash. Neurological: Negative for headaches, focal weakness or numbness.  ____________________________________________   PHYSICAL EXAM:  VITAL SIGNS: ED Triage Vitals [10/17/19 0555]  Enc Vitals Group     BP      Pulse      Resp       Temp      Temp src      SpO2      Weight      Height      Head Circumference      Peak Flow      Pain Score 3     Pain Loc      Pain Edu?      Excl. in [place]?     Constitutional: Alert and oriented.  Eyes: Conjunctivae are normal.  Mouth/Throat: Patient is wearing a mask. Neck: No stridor.  No meningeal signs.   Cardiovascular: Normal rate, regular rhythm. Good peripheral circulation. Grossly normal heart sounds. Respiratory: Normal respiratory effort.  No retractions. Gastrointestinal: Soft and nontender. No distention.  Musculoskeletal: No lower extremity tenderness nor edema. No gross deformities of extremities. Neurologic:  Normal speech and language. No gross focal neurologic deficits are appreciated.  Skin:  Skin is warm, dry and intact. Psychiatric: Mood and affect are normal. Speech and behavior are normal.  ____________________________________________   LABS (all labs ordered are listed, but only abnormal results are displayed)  Labs Reviewed  URINALYSIS, COMPLETE (UACMP) WITH MICROSCOPIC - Abnormal; Notable for the following components:      Result Value   Color, Urine YELLOW (*)    APPearance CLEAR (*)    Hgb urine dipstick SMALL (*)    Protein, ur 30 (*)    Leukocytes,Ua SMALL (*)    Bacteria, UA RARE (*)    All other components within normal limits  SARS CORONAVIRUS 2 (TAT 6-24 HRS)  POC SARS CORONAVIRUS 2 AG -  ED  POC SARS CORONAVIRUS 2 AG   _______________________  RADIOLOGY I, Thebes Ernst Bowler, personally viewed and evaluated these images (plain radiographs) as part of my medical decision making, as well as reviewing the written report by the radiologist.  ED MD interpretation: Negative chest x-ray per radiologist.  Official radiology report(s): DG Chest Portable 1 View  Result Date: 10/17/2019 CLINICAL DATA:  Cough and fever EXAM: PORTABLE CHEST 1 VIEW COMPARISON:  08/23/2007 FINDINGS: Normal heart size and mediastinal contours. There is no edema,  consolidation, effusion, or pneumothorax. No osseous findings. IMPRESSION: Negative chest. Electronically Signed   By: Monte Fantasia M.D.   On: 10/17/2019 06:18      Procedures   ____________________________________________   INITIAL IMPRESSION / MDM / ASSESSMENT AND PLAN / ED COURSE  As part of my medical decision making, I reviewed the following data within the electronic MEDICAL RECORD NUMBER  39 year old female presented with above-stated history and physical exam concerning for viral respiratory illness including COVID-19 as well as, pneumonia, bronchitis, influenza.  Rapid COVID-19 testing negative in the emergency department confirmatory test pending.  Chest x-ray revealed no evidence of pneumonia or any other gross abnormality.  Patient prescribed azithromycin, Tessalon Perles and albuterol for home with recommendation to follow-up on Covid testing later on today. ____________________________________________  FINAL CLINICAL  IMPRESSION(S) / ED DIAGNOSES  Final diagnoses:  Viral respiratory illness     MEDICATIONS GIVEN DURING THIS VISIT:  Medications - No data to display   ED Discharge Orders         Ordered    azithromycin (ZITHROMAX) 500 MG tablet  Daily     10/17/19 0626    benzonatate (TESSALON PERLES) 100 MG capsule  3 times daily PRN     10/17/19 0626    albuterol (VENTOLIN HFA) 108 (90 Base) MCG/ACT inhaler  Every 6 hours PRN     10/17/19 0626          *Please note:  Paige Perez was evaluated in Emergency Department on 10/17/2019 for the symptoms described in the history of present illness. She was evaluated in the context of the global COVID-19 pandemic, which necessitated consideration that the patient might be at risk for infection with the SARS-CoV-2 virus that causes COVID-19. Institutional protocols and algorithms that pertain to the evaluation of patients at risk for COVID-19 are in a state of rapid change based on information released by regulatory  bodies including the CDC and federal and state organizations. These policies and algorithms were followed during the patient's care in the ED.  Some ED evaluations and interventions may be delayed as a result of limited staffing during the pandemic.*  Note:  This document was prepared using Dragon voice recognition software and may include unintentional dictation errors.   Gregor Hams, MD 10/17/19 [phone]

## 2019-10-17 NOTE — ED Notes (Signed)
Patient called and notified of + COVID result.  Understanding verbalized.

## 2019-10-17 NOTE — ED Notes (Signed)
Pt arrives c/o fever, dry cough, chills that started yesterday. Pt has no known exposure to covid but works at [employer].

## 2019-10-18 ENCOUNTER — Telehealth: Payer: Self-pay | Admitting: Emergency Medicine

## 2019-10-18 NOTE — Telephone Encounter (Signed)
Called to assure she is aware of covid positive.  She is aware and someone called her earlier.

## 2019-10-25 ENCOUNTER — Other Ambulatory Visit: Payer: Self-pay | Admitting: Obstetrics and Gynecology

## 2019-10-25 DIAGNOSIS — Z309 Encounter for contraceptive management, unspecified: Secondary | ICD-10-CM

## 2019-10-25 NOTE — Telephone Encounter (Signed)
Advise

## 2019-11-01 ENCOUNTER — Telehealth: Payer: Self-pay

## 2019-11-01 NOTE — Telephone Encounter (Signed)
Pt calling; was seen in ED 1/14; had positive covid test; has done quarantine; ready to return to work.  Work needs a doctor's note clearing her to return to work.  She has no PCP.  [phone]

## 2019-11-05 NOTE — Telephone Encounter (Signed)
To be cleared to return to work she has to have been asymptomatic for 10 days.  The letter should also say that she should not undergo repeat testing for Covid for 90 days as this may still be positive.  If she develops any new symptoms, then she should consider staying away from work. If she meets the 10-day criteria, she should be OK to return to work and you could write her a note on my behalf. Thank you!

## 2019-11-05 NOTE — Telephone Encounter (Signed)
Message left for patient to call and schedule annual.  Per SDJ last appt, patient due for annual around 04/04/2020.

## 2019-11-05 NOTE — Telephone Encounter (Signed)
Pt has gotten a letter for work from the [employer].

## 2020-06-26 ENCOUNTER — Other Ambulatory Visit: Payer: Self-pay

## 2020-06-26 DIAGNOSIS — Z309 Encounter for contraceptive management, unspecified: Secondary | ICD-10-CM

## 2020-06-26 MED ORDER — NORGESTIMATE-ETH ESTRADIOL 0.25-35 MG-MCG PO TABS: 1.0000 | ORAL_TABLET | Freq: Every day | ORAL | 0 refills | Status: DC

## 2020-06-26 NOTE — Telephone Encounter (Signed)
Pt calling; has sched appt for 10/26; needs refill of bc until appt.  [phone] Pt aware refill eRx'd.

## 2020-07-28 ENCOUNTER — Ambulatory Visit: Payer: BC Managed Care – PPO | Admitting: Obstetrics and Gynecology

## 2020-08-11 ENCOUNTER — Ambulatory Visit (INDEPENDENT_AMBULATORY_CARE_PROVIDER_SITE_OTHER): Payer: BC Managed Care – PPO | Admitting: Obstetrics and Gynecology

## 2020-08-11 ENCOUNTER — Encounter: Payer: Self-pay | Admitting: Obstetrics and Gynecology

## 2020-08-11 ENCOUNTER — Other Ambulatory Visit (HOSPITAL_COMMUNITY)
Admission: RE | Admit: 2020-08-11 | Discharge: 2020-08-11 | Disposition: A | Discharge status: Home / Self Care | Payer: BC Managed Care – PPO | Source: Ambulatory Visit | Attending: Obstetrics and Gynecology | Admitting: Obstetrics and Gynecology

## 2020-08-11 ENCOUNTER — Other Ambulatory Visit: Payer: Self-pay

## 2020-08-11 VITALS — BP 126/84 | Ht 66.0 in | Wt 307.0 lb

## 2020-08-11 DIAGNOSIS — Z1331 Encounter for screening for depression: Secondary | ICD-10-CM | POA: Diagnosis not present

## 2020-08-11 DIAGNOSIS — Z3041 Encounter for surveillance of contraceptive pills: Secondary | ICD-10-CM

## 2020-08-11 DIAGNOSIS — Z1339 Encounter for screening examination for other mental health and behavioral disorders: Secondary | ICD-10-CM | POA: Diagnosis not present

## 2020-08-11 DIAGNOSIS — R87612 Low grade squamous intraepithelial lesion on cytologic smear of cervix (LGSIL): Secondary | ICD-10-CM

## 2020-08-11 DIAGNOSIS — Z01419 Encounter for gynecological examination (general) (routine) without abnormal findings: Secondary | ICD-10-CM

## 2020-08-11 DIAGNOSIS — Z309 Encounter for contraceptive management, unspecified: Secondary | ICD-10-CM

## 2020-08-11 MED ORDER — NORGESTIMATE-ETH ESTRADIOL 0.25-35 MG-MCG PO TABS: 1.0000 | ORAL_TABLET | Freq: Every day | ORAL | 4 refills | Status: DC

## 2020-08-11 NOTE — Progress Notes (Signed)
Gynecology Annual Exam  PCP: Patient, No Pcp Per  Chief Complaint  Patient presents with  . Annual Exam   History of Present Illness:  Paige Perez is a 39 y.o. [id number] who LMP was Patient's last menstrual period was 07/21/2020., presents today for her annual examination.  Her menses are regular every 28-30 days, lasting 5 day(s).  Dysmenorrhea none. She does not have intermenstrual bleeding.  She is sexually active. She uses COCPs for contraception.   Last Pap:   07/2017: LGSIL 09/2017: Colposcopy CIN1 10/2018: NILM, HPV negative Hx of STDs: none  There is no FH of breast cancer. There is no FH of ovarian cancer. The patient does not do self-breast exams.  Tobacco use: The patient denies current or previous tobacco use. Alcohol use: none Exercise: works 50 hours a week  The patient wears seatbelts: yes.   The patient reports that domestic violence in her life is absent.   Past Medical History:  Diagnosis Date  . Abnormal Pap smear of cervix   . Anemia   . Uterine bleeding   . Uterine polyp     Past Surgical History:  Procedure Laterality Date  . CESAREAN SECTION    . CESAREAN SECTION    . DILATATION & CURETTAGE/HYSTEROSCOPY WITH MYOSURE N/A 11/06/2015   Procedure: DILATATION & CURETTAGE WITH MY SURE;  Surgeon: Will Bonnet, MD;  Location: [facility];  Service: Gynecology;  Laterality: N/A;  . HYSTEROSCOPY N/A 11/06/2015   Procedure: HYSTEROSCOPY WITH REMOVAL OF CERVICAL MASS;  Surgeon: Will Bonnet, MD;  Location: [facility];  Service: Gynecology;  Laterality: N/A;  . HYSTEROSCOPY WITH D & C N/A 03/21/2019   Procedure: DILATATION AND CURETTAGE /HYSTEROSCOPY - REMOVAL OF ENDOMETRIAL MASS;  Surgeon: Will Bonnet, MD;  Location: [facility];  Service: Gynecology;  Laterality: N/A;  . MYOMECTOMY    . TONSILLECTOMY     age7    Prior to Admission medications   Medication Sig Start Date End Date Taking? Authorizing Provider  Ferrous Sulfate (IRON) 325 (65 Fe) MG  TABS Take 325 mg by mouth daily.    Yes [provider]  norgestimate-ethinyl estradiol ([place] 28) 0.25-35 MG-MCG tablet Take 1 tablet by mouth daily. 06/26/20  Yes Will Bonnet, MD   Allergies:  No Known Allergies  Obstetric History: [id number]  Social History   Socioeconomic History  . Marital status: Married    Spouse name: Not on file  . Number of children: Not on file  . Years of education: Not on file  . Highest education level: Not on file  Occupational History  . Not on file  Tobacco Use  . Smoking status: Never Smoker  . Smokeless tobacco: Never Used  . Tobacco comment: passive smoke outside of home  Vaping Use  . Vaping Use: Never used  Substance and Sexual Activity  . Alcohol use: Yes    Comment: occasionaly  . Drug use: No  . Sexual activity: Yes    Birth control/protection: Pill  Other Topics Concern  . Not on file  Social History Narrative   ** Merged History Encounter **       Social Determinants of Health   Financial Resource Strain:   . Difficulty of Paying Living Expenses: Not on file  Food Insecurity:   . Worried About Charity fundraiser in the Last Year: Not on file  . Ran Out of Food in the Last Year: Not on file  Transportation Needs:   .  Lack of Transportation (Medical): Not on file  . Lack of Transportation (Non-Medical): Not on file  Physical Activity:   . Days of Exercise per Week: Not on file  . Minutes of Exercise per Session: Not on file  Stress:   . Feeling of Stress : Not on file  Social Connections:   . Frequency of Communication with Friends and Family: Not on file  . Frequency of Social Gatherings with Friends and Family: Not on file  . Attends Religious Services: Not on file  . Active Member of Clubs or Organizations: Not on file  . Attends Archivist Meetings: Not on file  . Marital Status: Not on file  Intimate Partner Violence:   . Fear of Current or Ex-Partner: Not on file  . Emotionally  Abused: Not on file  . Physically Abused: Not on file  . Sexually Abused: Not on file    Family History  Problem Relation Age of Onset  . Seizures Mother   . Diabetes Mellitus II Maternal Grandmother     Review of Systems  Constitutional: Negative.   HENT: Negative.   Eyes: Negative.   Respiratory: Negative.   Cardiovascular: Negative.   Gastrointestinal: Negative.   Genitourinary: Negative.   Musculoskeletal: Negative.   Skin: Negative.   Neurological: Negative.   Psychiatric/Behavioral: Negative.      Physical Exam BP 126/84   Ht 5\' 6"  (1.676 m)   Wt (!) 307 lb (139.3 kg)   LMP 07/21/2020   BMI 49.55 kg/m    Physical Exam Constitutional:      General: She is not in acute distress.    Appearance: Normal appearance. She is well-developed.  Genitourinary:     Pelvic exam was performed with patient in the lithotomy position.     Vulva, urethra, bladder and uterus normal.     No inguinal adenopathy present in the right or left side.    No signs of injury in the vagina.     No vaginal discharge, erythema, tenderness or bleeding.     No cervical motion tenderness, discharge, lesion or polyp.     Uterus is mobile.     Uterus is not enlarged or tender.     No uterine mass detected.    Uterus is anteverted.     No right or left adnexal mass present.     Right adnexa not tender or full.     Left adnexa not tender or full.  HENT:     Head: Normocephalic and atraumatic.  Eyes:     General: No scleral icterus.    Conjunctiva/sclera: Conjunctivae normal.  Neck:     Thyroid: No thyromegaly.  Cardiovascular:     Rate and Rhythm: Normal rate and regular rhythm.     Heart sounds: No murmur heard.  No friction rub. No gallop.   Pulmonary:     Effort: Pulmonary effort is normal. No respiratory distress.     Breath sounds: Normal breath sounds. No wheezing or rales.  Chest:     Breasts:        Right: No inverted nipple, mass, nipple discharge, skin change or tenderness.         Left: No inverted nipple, mass, nipple discharge, skin change or tenderness.  Abdominal:     General: Bowel sounds are normal. There is no distension.     Palpations: Abdomen is soft. There is no mass.     Tenderness: There is no abdominal tenderness. There is no guarding or  rebound.  Musculoskeletal:        General: No swelling or tenderness. Normal range of motion.     Cervical back: Normal range of motion and neck supple.  Lymphadenopathy:     Cervical: No cervical adenopathy.     Lower Body: No right inguinal adenopathy. No left inguinal adenopathy.  Neurological:     General: No focal deficit present.     Mental Status: She is alert and oriented to person, place, and time.     Cranial Nerves: No cranial nerve deficit.  Skin:    General: Skin is warm and dry.     Findings: No erythema or rash.  Psychiatric:        Mood and Affect: Mood normal.        Behavior: Behavior normal.        Judgment: Judgment normal.     Female chaperone present for pelvic and breast  portions of the physical exam  Results: AUDIT Questionnaire (screen for alcoholism): 0 PHQ-9: 3 GAD7: 1   Assessment: 39 y.o. G90P2001 female here for routine annual gynecologic examination  Plan: Problem List Items Addressed This Visit      Other   LGSIL on Pap smear of cervix   Relevant Orders   Cytology - PAP    Other Visit Diagnoses    Women's annual routine gynecological examination    -  Primary   Relevant Medications   norgestimate-ethinyl estradiol ([place] 28) 0.25-35 MG-MCG tablet   Other Relevant Orders   Cytology - PAP   Screening for depression       Screening for alcoholism       Encounter for surveillance of contraceptive pills       Encounter for contraceptive management, unspecified type       Relevant Medications   norgestimate-ethinyl estradiol ([place] 28) 0.25-35 MG-MCG tablet      Screening: -- Blood pressure screen normal -- Weight screening: obese: discussed  management options, including lifestyle, dietary, and exercise. -- Depression screening negative (PHQ-9) -- Nutrition: normal -- cholesterol screening: not due for screening -- osteoporosis screening: not due -- tobacco screening: not using -- alcohol screening: AUDIT questionnaire indicates low-risk usage. -- family history of breast cancer screening: done. not at high risk. -- no evidence of domestic violence or intimate partner violence. -- STD screening: gonorrhea/chlamydia NAAT not collected per patient request. -- pap smear collected per ASCCP guidelines -- flu vaccine received  Rx for birth control refilled.   Prentice Docker, MD 08/11/2020 12:04 PM

## 2020-08-15 LAB — CYTOLOGY - PAP
Comment: NEGATIVE
Diagnosis: UNDETERMINED — AB
High risk HPV: NEGATIVE

## 2021-06-10 ENCOUNTER — Other Ambulatory Visit: Payer: Self-pay

## 2021-06-10 ENCOUNTER — Emergency Department: Payer: BC Managed Care – PPO

## 2021-06-10 ENCOUNTER — Emergency Department
Admission: EM | Admit: 2021-06-10 | Discharge: 2021-06-10 | Disposition: A | Discharge status: Home / Self Care | Payer: BC Managed Care – PPO | Attending: Student in an Organized Health Care Education/Training Program | Admitting: Student in an Organized Health Care Education/Training Program

## 2021-06-10 DIAGNOSIS — R1011 Right upper quadrant pain: Secondary | ICD-10-CM | POA: Diagnosis present

## 2021-06-10 DIAGNOSIS — N3000 Acute cystitis without hematuria: Secondary | ICD-10-CM | POA: Insufficient documentation

## 2021-06-10 LAB — URINALYSIS, COMPLETE (UACMP) WITH MICROSCOPIC
Bilirubin Urine: NEGATIVE
Glucose, UA: NEGATIVE mg/dL
Ketones, ur: NEGATIVE mg/dL
Leukocytes,Ua: NEGATIVE
Nitrite: POSITIVE — AB
Protein, ur: >300 mg/dL — AB
Specific Gravity, Urine: 1.025 (ref 1.005–1.030)
pH: 5.5 (ref 5.0–8.0)

## 2021-06-10 LAB — CBC
HCT: 37.2 % (ref 36.0–46.0)
Hemoglobin: 12.7 g/dL (ref 12.0–15.0)
MCH: 29.2 pg (ref 26.0–34.0)
MCHC: 34.1 g/dL (ref 30.0–36.0)
MCV: 85.5 fL (ref 80.0–100.0)
Platelets: 260 10*3/uL (ref 150–400)
RBC: 4.35 MIL/uL (ref 3.87–5.11)
RDW: 12.6 % (ref 11.5–15.5)
WBC: 7.3 10*3/uL (ref 4.0–10.5)
nRBC: 0 % (ref 0.0–0.2)

## 2021-06-10 LAB — COMPREHENSIVE METABOLIC PANEL
ALT: 25 U/L (ref 0–44)
AST: 21 U/L (ref 15–41)
Albumin: 3.7 g/dL (ref 3.5–5.0)
Alkaline Phosphatase: 66 U/L (ref 38–126)
Anion gap: 8 (ref 5–15)
BUN: 13 mg/dL (ref 6–20)
CO2: 24 mmol/L (ref 22–32)
Calcium: 9.3 mg/dL (ref 8.9–10.3)
Chloride: 104 mmol/L (ref 98–111)
Creatinine, Ser: 1.16 mg/dL — ABNORMAL HIGH (ref 0.44–1.00)
GFR, Estimated: >60 mL/min (ref >60)
Glucose, Bld: 107 mg/dL — ABNORMAL HIGH (ref 70–99)
Potassium: 4.2 mmol/L (ref 3.5–5.1)
Sodium: 136 mmol/L (ref 135–145)
Total Bilirubin: 0.6 mg/dL (ref 0.3–1.2)
Total Protein: 7.3 g/dL (ref 6.5–8.1)

## 2021-06-10 LAB — POC URINE PREG, ED: Preg Test, Ur: NEGATIVE

## 2021-06-10 LAB — LIPASE, BLOOD: Lipase: 25 U/L (ref 11–51)

## 2021-06-10 MED ORDER — CEPHALEXIN 500 MG PO CAPS: 500.0000 mg | ORAL_CAPSULE | Freq: Three times a day (TID) | ORAL | 0 refills | Status: AC

## 2021-06-10 MED ORDER — MORPHINE SULFATE (PF) 4 MG/ML IV SOLN
4.0000 mg | INTRAVENOUS | Status: DC | PRN
  Administered 2021-06-10: 4 mg via INTRAVENOUS
  Filled 2021-06-10: qty 1

## 2021-06-10 MED ORDER — ONDANSETRON HCL 4 MG PO TABS: 4.0000 mg | ORAL_TABLET | Freq: Every day | ORAL | 0 refills | Status: DC | PRN

## 2021-06-10 MED ORDER — IOHEXOL 350 MG/ML SOLN
100.0000 mL | Freq: Once | INTRAVENOUS | Status: AC | PRN
  Administered 2021-06-10: 100 mL via INTRAVENOUS
  Filled 2021-06-10: qty 100

## 2021-06-10 MED ORDER — SODIUM CHLORIDE 0.9 % IV SOLN
1.0000 g | Freq: Once | INTRAVENOUS | Status: AC
  Administered 2021-06-10: 1 g via INTRAVENOUS
  Filled 2021-06-10: qty 10

## 2021-06-10 MED ORDER — ONDANSETRON HCL 4 MG/2ML IJ SOLN
4.0000 mg | Freq: Once | INTRAMUSCULAR | Status: AC
  Administered 2021-06-10: 4 mg via INTRAVENOUS
  Filled 2021-06-10: qty 2

## 2021-06-10 NOTE — ED Triage Notes (Signed)
Pt to ER via POV with complaints of right sided abdominal pain. Reports on Tuesday she started experiencing left sided pain and took a gas ex, then last night started experiencing the pain again. Woke up this morning and the pain was on the right side. Reports diarrhea since Monday. Constant nausea. No vomiting. No urinary symptoms.

## 2021-06-10 NOTE — ED Provider Notes (Signed)
[facility] Emergency Department Provider Note    Event Date/Time   First MD Initiated Contact with Patient 06/10/21 1228     (approximate)  I have reviewed the triage vital signs and the nursing notes.   HISTORY  Chief Complaint Abdominal Pain    HPI Paige Perez is a 40 y.o. female the below listed past medical history presents to the ER for evaluation of what initially started as left-sided abdominal pain now having right upper quadrant pain associate with nausea.  States that driving down the road was very uncomfortable.  Denies any measured fevers.  Symptoms started for more than 2 days ago pain becoming more localized to the right upper quadrant over the past 24 hours.  No previous abdominal surgeries.  Denies any dysuria.  No chest pain or shortness of breath.  Past Medical History:  Diagnosis Date   Abnormal Pap smear of cervix    Anemia    Uterine bleeding    Uterine polyp    Family History  Problem Relation Age of Onset   Seizures Mother    Diabetes Mellitus II Maternal Grandmother    Past Surgical History:  Procedure Laterality Date   CESAREAN SECTION     CESAREAN SECTION     DILATATION & CURETTAGE/HYSTEROSCOPY WITH MYOSURE N/A 11/06/2015   Procedure: DILATATION & CURETTAGE WITH MY SURE;  Surgeon: Will Bonnet, MD;  Location: [facility] OR[facility];  Service: Gynecology;  Laterality: N/A;   HYSTEROSCOPY N/A 11/06/2015   Procedure: HYSTEROSCOPY WITH REMOVAL OF CERVICAL MASS;  Surgeon: Will Bonnet, MD;  Location: [facility] OR[facility];  Service: Gynecology;  Laterality: N/A;   HYSTEROSCOPY WITH D & C N/A 03/21/2019   Procedure: DILATATION AND CURETTAGE /HYSTEROSCOPY - REMOVAL OF ENDOMETRIAL MASS;  Surgeon: Will Bonnet, MD;  Location: [facility] ORS;  Service: Gynecology;  Laterality: N/A;   MYOMECTOMY     TONSILLECTOMY     age7   Patient Active Problem List   Diagnosis Date Noted   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 Authorizing Provider  cephALEXin (KEFLEX) 500 MG capsule Take 1 capsule (500 mg total) by mouth 3 (three) times daily for 7 days. 06/10/21 06/17/21 Yes Merlyn Lot, MD  ondansetron (ZOFRAN) 4 MG tablet Take 1 tablet (4 mg total) by mouth daily as needed. 06/10/21 06/10/22 Yes Merlyn Lot, MD  Ferrous Sulfate (IRON) 325 (65 Fe) MG TABS Take 325 mg by mouth daily.     [provider]  norgestimate-ethinyl estradiol ([place] 28) 0.25-35 MG-MCG tablet Take 1 tablet by mouth daily. 08/11/20   Will Bonnet, MD    Allergies Patient has no known allergies.    Social History Social History   Tobacco Use   Smoking status: Never   Smokeless tobacco: Never   Tobacco comments:    passive smoke outside of home  Vaping Use   Vaping Use: Never used  Substance Use Topics   Alcohol use: Yes    Comment: occasionaly   Drug use: No    Review of Systems Patient denies headaches, rhinorrhea, blurry vision, numbness, shortness of breath, chest pain, edema, cough, abdominal pain, nausea, vomiting, diarrhea, dysuria, fevers, rashes or hallucinations unless otherwise stated above in HPI. ____________________________________________   PHYSICAL EXAM:  VITAL SIGNS: Vitals:  06/10/21 1441 06/10/21 1556  BP: (!) 187/107 93/61  Pulse: 78 71  Resp: 18 18  Temp:    SpO2: 100% 99%    Constitutional: Alert and oriented.  Eyes: Conjunctivae are normal.  Head: Atraumatic. Nose: No congestion/rhinnorhea. Mouth/Throat: Mucous membranes are moist.   Neck: No stridor. Painless ROM.  Cardiovascular: Normal rate, regular rhythm. Grossly normal heart sounds.  Good peripheral circulation. Respiratory: Normal respiratory effort.  No retractions. Lungs CTAB. Gastrointestinal: Soft with ttp in ruq. No distention. No  abdominal bruits. No CVA tenderness. Genitourinary:  Musculoskeletal: No lower extremity tenderness nor edema.  No joint effusions. Neurologic:  Normal speech and language. No gross focal neurologic deficits are appreciated. No facial droop Skin:  Skin is warm, dry and intact. No rash noted. Psychiatric: Mood and affect are normal. Speech and behavior are normal.  ____________________________________________   LABS (all labs ordered are listed, but only abnormal results are displayed)  Results for orders placed or performed during the hospital encounter of 06/10/21 (from the past 24 hour(s))  Lipase, blood     Status: None   Collection Time: 06/10/21 11:26 AM  Result Value Ref Range   Lipase 25 11 - 51 U/L  Comprehensive metabolic panel     Status: Abnormal   Collection Time: 06/10/21 11:26 AM  Result Value Ref Range   Sodium 136 135 - 145 mmol/L   Potassium 4.2 3.5 - 5.1 mmol/L   Chloride 104 98 - 111 mmol/L   CO2 24 22 - 32 mmol/L   Glucose, Bld 107 (H) 70 - 99 mg/dL   BUN 13 6 - 20 mg/dL   Creatinine, Ser 1.16 (H) 0.44 - 1.00 mg/dL   Calcium 9.3 8.9 - 10.3 mg/dL   Total Protein 7.3 6.5 - 8.1 g/dL   Albumin 3.7 3.5 - 5.0 g/dL   AST 21 15 - 41 U/L   ALT 25 0 - 44 U/L   Alkaline Phosphatase 66 38 - 126 U/L   Total Bilirubin 0.6 0.3 - 1.2 mg/dL   GFR, Estimated >60 >60 mL/min   Anion gap 8 5 - 15  CBC     Status: None   Collection Time: 06/10/21 11:26 AM  Result Value Ref Range   WBC 7.3 4.0 - 10.5 K/uL   RBC 4.35 3.87 - 5.11 MIL/uL   Hemoglobin 12.7 12.0 - 15.0 g/dL   HCT 37.2 36.0 - 46.0 %   MCV 85.5 80.0 - 100.0 fL   MCH 29.2 26.0 - 34.0 pg   MCHC 34.1 30.0 - 36.0 g/dL   RDW 12.6 11.5 - 15.5 %   Platelets 260 150 - 400 K/uL   nRBC 0.0 0.0 - 0.2 %  Urinalysis, Complete w Microscopic     Status: Abnormal   Collection Time: 06/10/21 11:26 AM  Result Value Ref Range   Color, Urine YELLOW YELLOW   APPearance CLEAR CLEAR   Specific Gravity, Urine 1.025 1.005 - 1.030    pH 5.5 5.0 - 8.0   Glucose, UA NEGATIVE NEGATIVE mg/dL   Hgb urine dipstick TRACE (A) NEGATIVE   Bilirubin Urine NEGATIVE NEGATIVE   Ketones, ur NEGATIVE NEGATIVE mg/dL   Protein, ur >300 (A) NEGATIVE mg/dL   Nitrite POSITIVE (A) NEGATIVE   Leukocytes,Ua NEGATIVE NEGATIVE   Squamous Epithelial / LPF 0-5 0 - 5   WBC, UA 11-20 0 - 5 WBC/hpf   RBC / HPF 0-5 0 - 5 RBC/hpf   Bacteria, UA FEW (A) NONE SEEN  POC  urine preg, ED     Status: None   Collection Time: 06/10/21  1:06 PM  Result Value Ref Range   Preg Test, Ur Negative Negative   ____________________________________________  EKG My review and personal interpretation at Time: 12:18   Indication: abd  Rate: 75  Rhythm: sinsu Axis: normal Other: normal intervals, no stemi ____________________________________________  RADIOLOGY  I personally reviewed all radiographic images ordered to evaluate for the above acute complaints and reviewed radiology reports and findings.  These findings were personally discussed with the patient.  Please see medical record for radiology report.  ____________________________________________   PROCEDURES  Procedure(s) performed:  Procedures    Critical Care performed: no ____________________________________________   INITIAL IMPRESSION / ASSESSMENT AND PLAN / ED COURSE  Pertinent labs & imaging results that were available during my care of the patient were reviewed by me and considered in my medical decision making (see chart for details).   DDX: pyelo, stone, cholelithiasis, cholecystitis, pancreatitis, enteritis, uti  Paige Perez is a 40 y.o. who presents to the ED with presentation as described above.  Patient nontoxic-appearing does have right upper quadrant tenderness palpation on exam.  Blood work is reassuring he is afebrile.  Given location of pain will start with ultrasound.  Ultrasound noted to have some findings suggestive of hemangioma but no biliary pathology.   Urinalysis does appear infected given her flank pain will order CT to evaluate for stone given the acuity of her pain.  Clinical Course as of 06/10/21 1603  Thu Jun 10, 2021  1554 Reassessed.  Discussed results of CT imaging with patient.  Receiving dose of Rocephin in her will discharge on oral antibiotics.  Discussed strict return precautions.  Patient agreeable to plan. [PR]    Clinical Course User Index [PR] Merlyn Lot, MD    The patient was evaluated in Emergency Department today for the symptoms described in the history of present illness. He/she was evaluated in the context of the global COVID-19 pandemic, which necessitated consideration that the patient might be at risk for infection with the SARS-CoV-2 virus that causes COVID-19. Institutional protocols and algorithms that pertain to the evaluation of patients at risk for COVID-19 are in a state of rapid change based on information released by regulatory bodies including the CDC and federal and state organizations. These policies and algorithms were followed during the patient's care in the ED.  As part of my medical decision making, I reviewed the following data within the [facility] notes reviewed and incorporated, Labs reviewed, notes from prior ED visits and [place] Controlled Substance Database   ____________________________________________   FINAL CLINICAL IMPRESSION(S) / ED DIAGNOSES  Final diagnoses:  RUQ abdominal pain  Acute cystitis without hematuria      NEW MEDICATIONS STARTED DURING THIS VISIT:  New Prescriptions   CEPHALEXIN (KEFLEX) 500 MG CAPSULE    Take 1 capsule (500 mg total) by mouth 3 (three) times daily for 7 days.   ONDANSETRON (ZOFRAN) 4 MG TABLET    Take 1 tablet (4 mg total) by mouth daily as needed.     Note:  This document was prepared using Dragon voice recognition software and may include unintentional dictation errors.    Merlyn Lot, MD 06/10/21  [phone]

## 2021-06-10 NOTE — Discharge Instructions (Addendum)
  IMPRESSION: 1. Urothelial thickening and hyperenhancement of the right greater than left collecting systems and ureters with perinephric as well as periureteric stranding. Findings are suggestive of ascending urinary tract infection. Correlation with urinalysis recommended. 2. Leiomyomatous uterus. 3. Colonic diverticulosis without findings of acute diverticulitis. 4. 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 More definitive characterization of the lesions could be obtained with nonemergent hepatic protocol MRI of the abdomen with and without contrast.

## 2021-07-12 ENCOUNTER — Encounter (HOSPITAL_COMMUNITY): Payer: Self-pay | Admitting: Radiology

## 2021-08-18 ENCOUNTER — Other Ambulatory Visit: Payer: Self-pay | Admitting: Obstetrics and Gynecology

## 2021-08-18 DIAGNOSIS — Z01419 Encounter for gynecological examination (general) (routine) without abnormal findings: Secondary | ICD-10-CM

## 2021-08-18 DIAGNOSIS — Z309 Encounter for contraceptive management, unspecified: Secondary | ICD-10-CM

## 2021-08-23 ENCOUNTER — Other Ambulatory Visit: Payer: Self-pay | Admitting: Obstetrics and Gynecology

## 2021-08-23 DIAGNOSIS — Z309 Encounter for contraceptive management, unspecified: Secondary | ICD-10-CM

## 2021-08-23 DIAGNOSIS — Z01419 Encounter for gynecological examination (general) (routine) without abnormal findings: Secondary | ICD-10-CM

## 2021-08-23 NOTE — Telephone Encounter (Signed)
Patient needs appointment prior to further refills

## 2021-10-03 IMAGING — US US ABDOMEN LIMITED
1 series · 14 of 25 positions shown · non-contrast
Comparison: None.

CLINICAL DATA: Right upper quadrant abdominal pain for 2 days

EXAM:
ULTRASOUND ABDOMEN LIMITED RIGHT UPPER QUADRANT

[Series 1: us abdomen limited · 0.23mm/px · 14 of 56 slices shown]
[im 1/56]
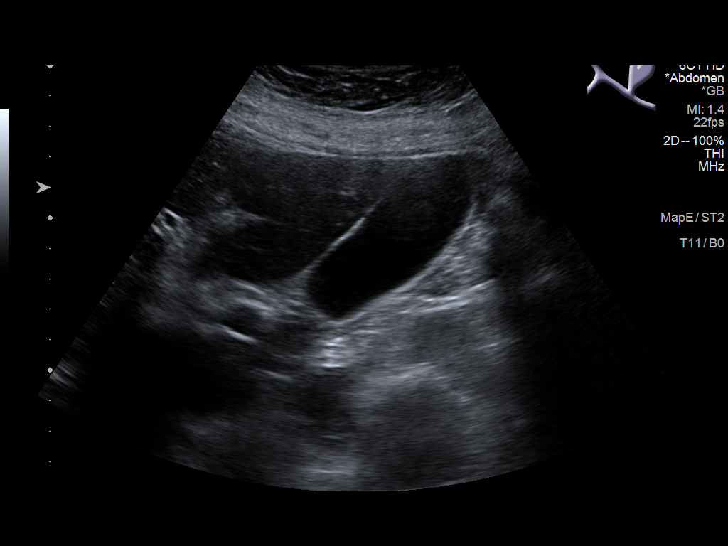
[im 5/56]
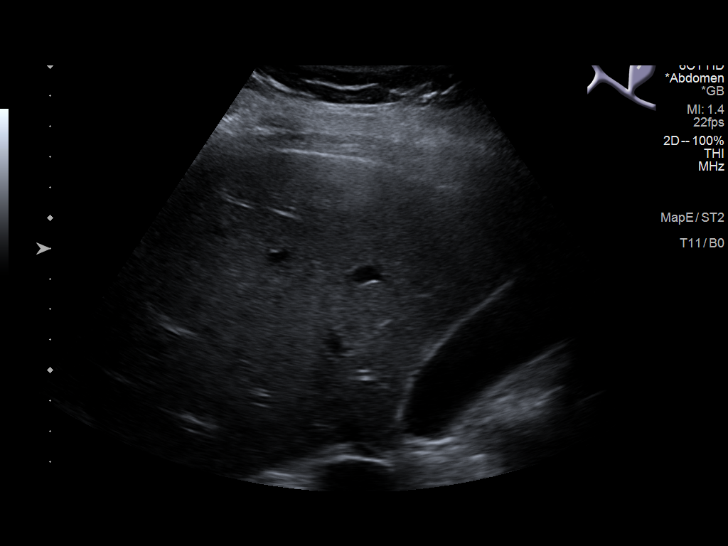
[im 10/56]
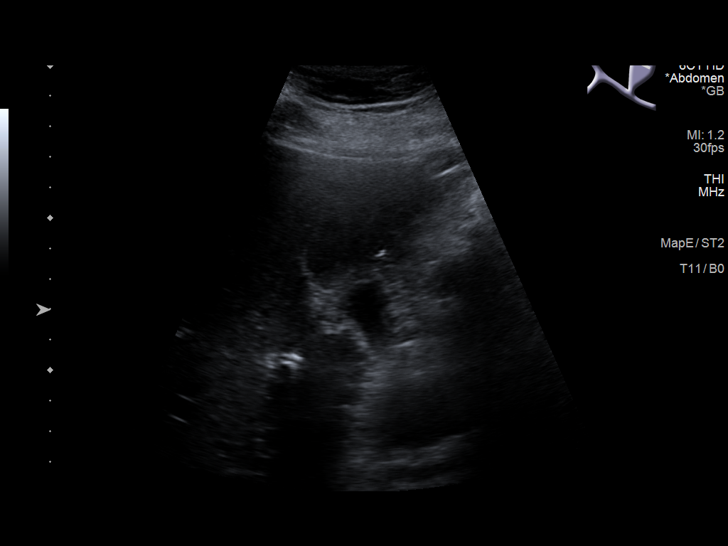
[im 14/56]
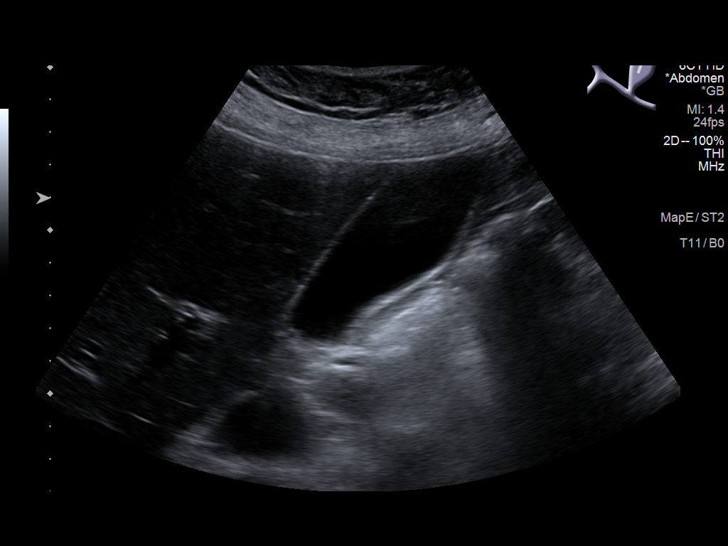
[im 19/56]
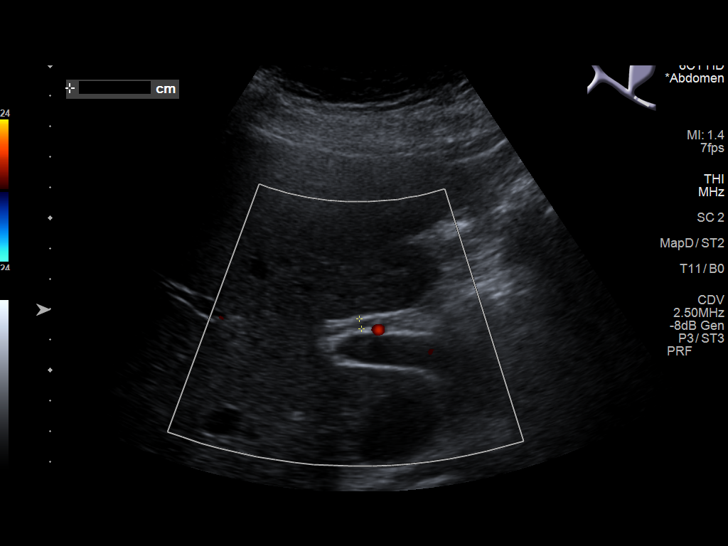
[im 21/56]
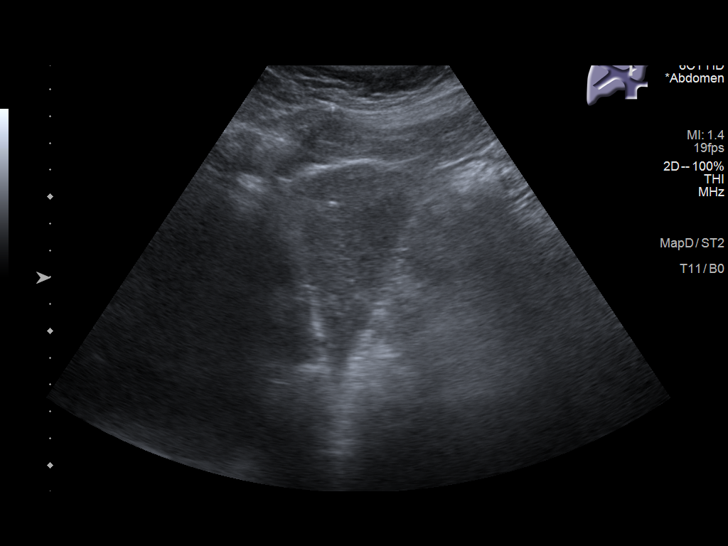
[im 26/56]
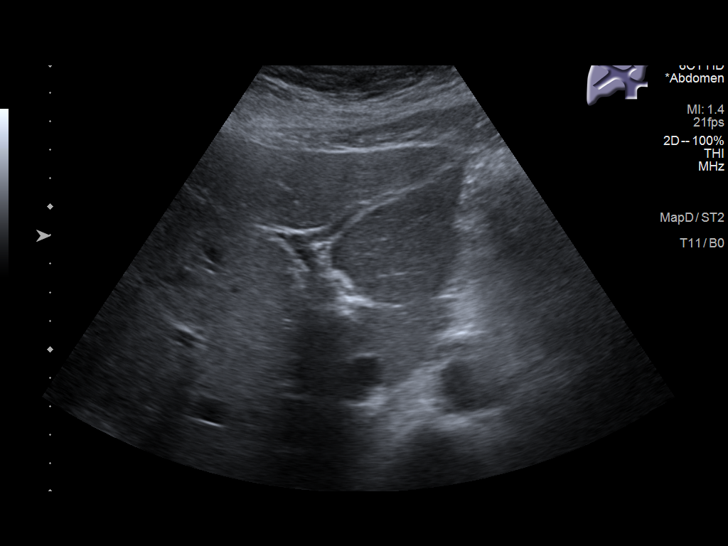
[im 30/56]
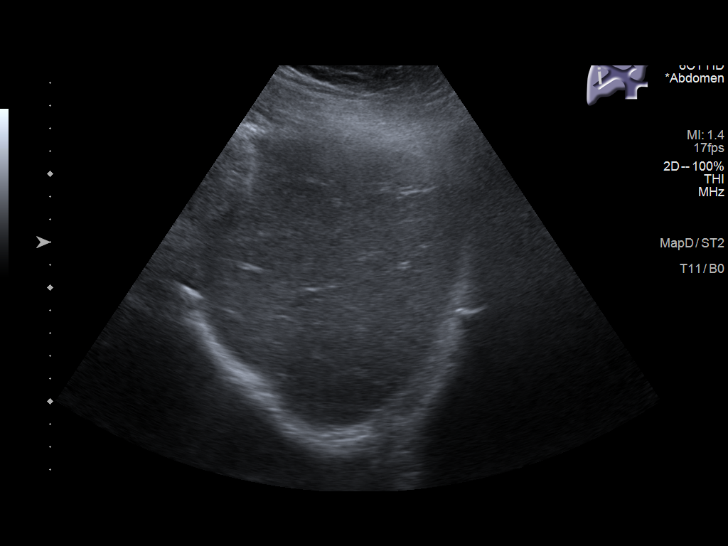
[im 35/56]
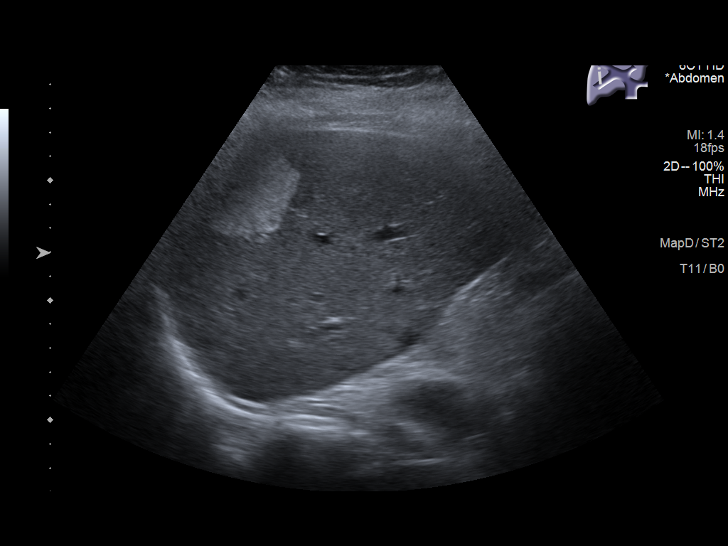
[im 37/56]
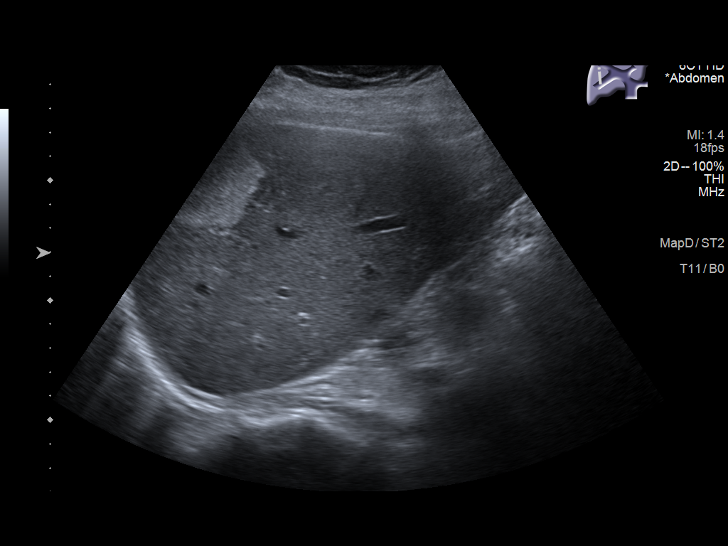
[im 42/56]
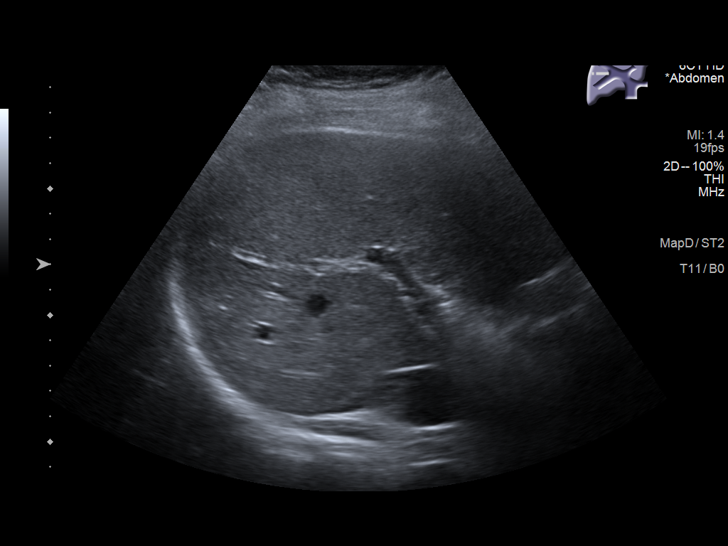
[im 46/56]
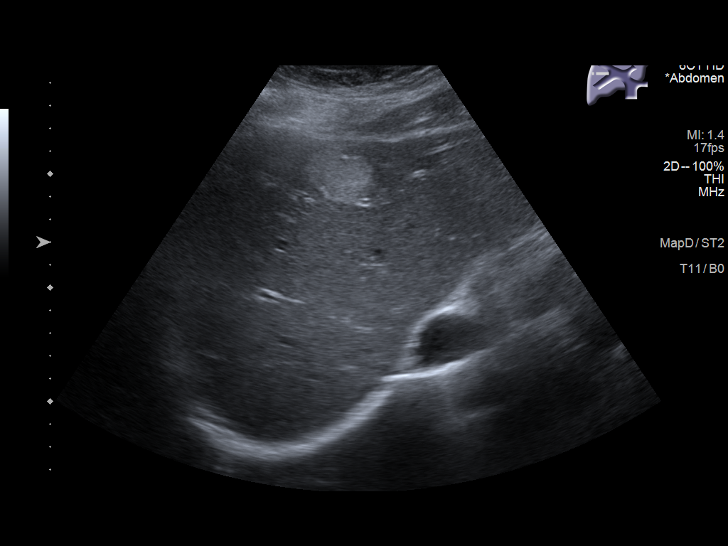
[im 51/56]
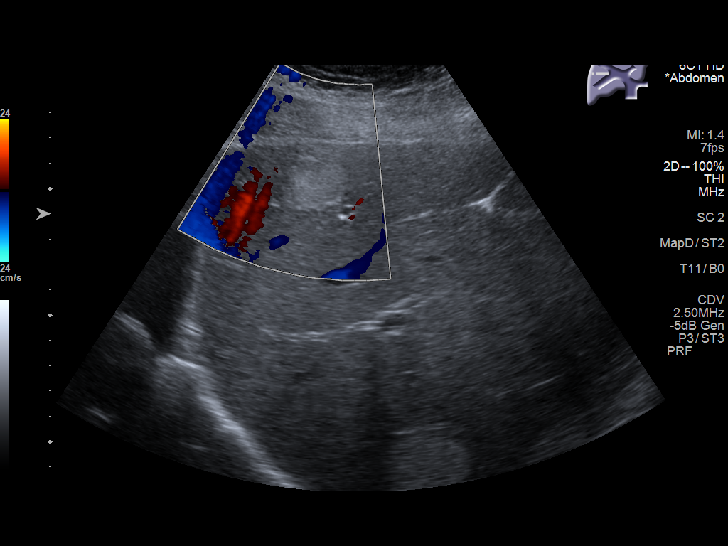
[im 56/56]
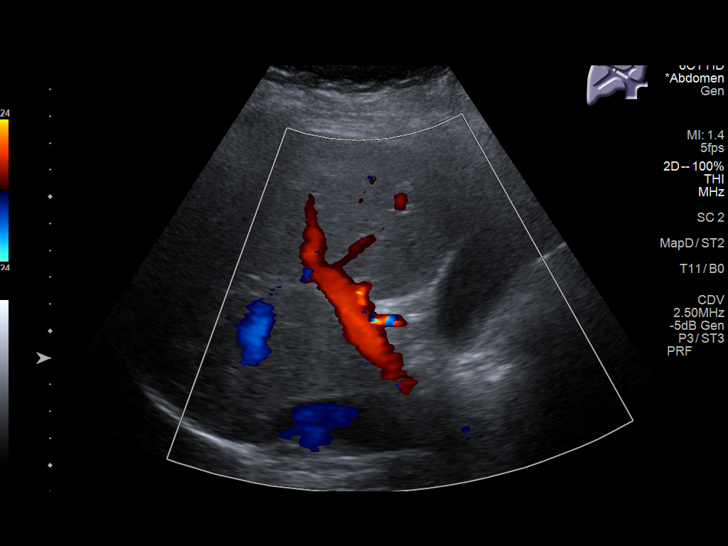

[14 of 25 positions shown; findings below may reference images not displayed]

FINDINGS: Gallbladder:

No gallstones or wall thickening visualized. No sonographic Murphy
sign noted by sonographer.

Common bile duct:

Diameter: 3 mm

Liver:

Hyperechoic subcapsular lesions of the anterior right lobe of the
liver measuring 3.7 x 2.5 x 2.1 cm and of the posterior right lobe
of the liver measuring 3.8 x 3.8 x 3.8 cm. Within normal limits in
parenchymal echogenicity. Portal vein is patent on color Doppler
imaging with normal direction of blood flow towards the liver.

Other: None.
IMPRESSION: 1. No ultrasound findings of the right upper quadrant to explain
pain.
2. Hyperechoic subcapsular lesions of the anterior right lobe of the
liver measuring 3.7 x 2.5 x 2.1 cm and of the posterior right lobe
of the liver measuring 3.8 x 3.8 x 3.8 cm. Given ultrasound
characteristics these are very likely benign incidental
hemangiomata, however incompletely characterized by ultrasound.
Recommend multiphasic contrast enhanced MRI on a nonemergent basis
to completely characterize.

## 2021-10-27 NOTE — Progress Notes (Signed)
PCP:  Patient, No Pcp Per (Inactive)   Chief Complaint  Patient presents with   Gynecologic Exam    Januarys cycle has been on/off since it started, first time ever     HPI:      Paige Perez is a 41 y.o. G2P2001 whose LMP was Patient's last menstrual period was 10/16/2021 (approximate)., presents today for her annual examination.  Her menses are regular every 28-30 days, lasting 4-5 days, mod flow.  Dysmenorrhea mild, no BTB. Did have irregular bleeding this cycle with cont dosing OCPs for a vacation. S/p myosure endometrial ablation with Dr. Glennon Mac 2/17; also s/p hyst D&C for endometrial mass/leio in 2020.   Sex activity: single partner, contraception - OCP (estrogen/progesterone).  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 There is no FH of ovarian cancer. The patient does do self-breast exams.  Tobacco use: The patient denies current or previous tobacco use. Alcohol use: none No drug use.  Exercise: moderately active Trying to lose wt with diet changes but not successful  She does get adequate calcium but not Vitamin D in her diet.  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   Morbid obesity ([place]) 07/14/2017   BMI 40.0-44.9, adult ([place]) 07/14/2017    Past Surgical History:  Procedure Laterality Date   CESAREAN SECTION     CESAREAN SECTION     DILATATION & CURETTAGE/HYSTEROSCOPY WITH MYOSURE N/A 11/06/2015   Procedure: DILATATION & CURETTAGE WITH MY SURE;  Surgeon: Will Bonnet, MD;  Location: [facility];  Service: Gynecology;  Laterality: N/A;   HYSTEROSCOPY N/A 11/06/2015   Procedure: HYSTEROSCOPY WITH REMOVAL OF CERVICAL  MASS;  Surgeon: Will Bonnet, MD;  Location: [facility];  Service: Gynecology;  Laterality: N/A;   HYSTEROSCOPY WITH D & C N/A 03/21/2019   Procedure: DILATATION AND CURETTAGE /HYSTEROSCOPY - REMOVAL OF ENDOMETRIAL MASS;  Surgeon: Will Bonnet, MD;  Location: [facility];  Service: Gynecology;  Laterality: N/A;   MYOMECTOMY     TONSILLECTOMY     age7    Family History  Problem Relation Age of Onset   Seizures Mother    Diabetes Mellitus II Maternal Grandmother     Social History   Socioeconomic History   Marital status: Married    Spouse name: Not on file   Number of children: Not on file   Years of education: Not on file   Highest education level: Not on file  Occupational History   Not on file  Tobacco Use   Smoking status: Never   Smokeless tobacco: Never   Tobacco comments:    passive smoke outside of home  Vaping Use   Vaping Use: Never used  Substance and Sexual Activity   Alcohol use: Yes    Comment: occasionaly   Drug use: No   Sexual activity: Yes    Birth control/protection: Pill  Other Topics Concern   Not on file  Social History Narrative   ** Merged History Encounter **       Social Determinants of Health   Financial Resource Strain: Not on file  Food Insecurity: Not  on file  Transportation Needs: Not on file  Physical Activity: Not on file  Stress: Not on file  Social Connections: Not on file  Intimate Partner Violence: Not on file     Current Outpatient Medications:    Ferrous Sulfate (IRON) 325 (65 Fe) MG TABS, Take 325 mg by mouth daily. , Disp: , Rfl:    norgestimate-ethinyl estradiol (SPRINTEC 28) 0.25-35 MG-MCG tablet, Take 1 tablet by mouth daily., Disp: 84 tablet, Rfl: 3     ROS:  Review of Systems  Constitutional:  Negative for fatigue, fever and unexpected weight change.  Respiratory:  Negative for cough, shortness of breath and wheezing.   Cardiovascular:  Negative for chest pain, palpitations and leg swelling.   Gastrointestinal:  Negative for blood in stool, constipation, diarrhea, nausea and vomiting.  Endocrine: Negative for cold intolerance, heat intolerance and polyuria.  Genitourinary:  Negative for dyspareunia, dysuria, flank pain, frequency, genital sores, hematuria, menstrual problem, pelvic pain, urgency, vaginal bleeding, vaginal discharge and vaginal pain.  Musculoskeletal:  Negative for back pain, joint swelling and myalgias.  Skin:  Negative for rash.  Neurological:  Negative for dizziness, syncope, light-headedness, numbness and headaches.  Hematological:  Negative for adenopathy.  Psychiatric/Behavioral:  Negative for agitation, confusion, sleep disturbance and suicidal ideas. The patient is not nervous/anxious.   BREAST: No symptoms   Objective: BP 140/90    Ht 5\' 6"  (1.676 m)    Wt (!) 306 lb (138.8 kg)    LMP 10/16/2021 (Approximate)    BMI 49.39 kg/m    Physical Exam Constitutional:      Appearance: She is well-developed.  Genitourinary:     Vulva normal.     Right Labia: No rash, tenderness or lesions.    Left Labia: No tenderness, lesions or rash.    No vaginal discharge, erythema or tenderness.      Right Adnexa: not tender and no mass present.    Left Adnexa: not tender and no mass present.    No cervical friability or polyp.     Uterus is not enlarged or tender.  Breasts:    Right: No mass, nipple discharge, skin change or tenderness.     Left: No mass, nipple discharge, skin change or tenderness.  Neck:     Thyroid: No thyromegaly.  Cardiovascular:     Rate and Rhythm: Normal rate and regular rhythm.     Heart sounds: Normal heart sounds. No murmur heard. Pulmonary:     Effort: Pulmonary effort is normal.     Breath sounds: Normal breath sounds.  Abdominal:     Palpations: Abdomen is soft.     Tenderness: There is no abdominal tenderness. There is no guarding or rebound.  Musculoskeletal:        General: Normal range of motion.     Cervical back:  Normal range of motion.  Lymphadenopathy:     Cervical: No cervical adenopathy.  Neurological:     General: No focal deficit present.     Mental Status: She is alert and oriented to person, place, and time.     Cranial Nerves: No cranial nerve deficit.  Skin:    General: Skin is warm and dry.  Psychiatric:        Mood and Affect: Mood normal.        Behavior: Behavior normal.        Thought Content: Thought content normal.        Judgment: Judgment normal.  Vitals reviewed.  Assessment/Plan: Encounter for annual routine gynecological examination  Cervical cancer screening - Plan: Cytology - PAP  Screening for HPV (human papillomavirus) - Plan: Cytology - PAP  ASCUS of cervix with negative high risk HPV - Plan: Cytology - PAP; repeat pap today. Will f/u if abn.   Encounter for surveillance of contraceptive pills - Plan: norgestimate-ethinyl estradiol (SPRINTEC 28) 0.25-35 MG-MCG tablet; OCP RF. BTB with cont dosing OCPs. F/u if sx persist after going back to monthly OCPs.  Leiomyoma  Meds ordered this encounter  Medications   norgestimate-ethinyl estradiol (SPRINTEC 28) 0.25-35 MG-MCG tablet    Sig: Take 1 tablet by mouth daily.    Dispense:  84 tablet    Refill:  3    Order Specific Question:   Supervising Provider    Answer:   Gae Dry [[id number]]             GYN counsel adequate intake of calcium and vitamin D, diet and exercise     F/U  Return in about 1 year (around 10/28/2022).  Torrez Renfroe B. Karin Pinedo, PA-C 10/28/2021 10:52 AM

## 2021-10-28 ENCOUNTER — Other Ambulatory Visit (HOSPITAL_COMMUNITY)
Admission: RE | Admit: 2021-10-28 | Discharge: 2021-10-28 | Disposition: A | Discharge status: Home / Self Care | Payer: BC Managed Care – PPO | Source: Ambulatory Visit | Attending: Obstetrics and Gynecology | Admitting: Obstetrics and Gynecology

## 2021-10-28 ENCOUNTER — Encounter: Payer: Self-pay | Admitting: Obstetrics and Gynecology

## 2021-10-28 ENCOUNTER — Ambulatory Visit (INDEPENDENT_AMBULATORY_CARE_PROVIDER_SITE_OTHER): Payer: BC Managed Care – PPO | Admitting: Obstetrics and Gynecology

## 2021-10-28 ENCOUNTER — Other Ambulatory Visit: Payer: Self-pay

## 2021-10-28 VITALS — BP 140/90 | Ht 66.0 in | Wt 306.0 lb

## 2021-10-28 DIAGNOSIS — Z124 Encounter for screening for malignant neoplasm of cervix: Secondary | ICD-10-CM | POA: Insufficient documentation

## 2021-10-28 DIAGNOSIS — Z01419 Encounter for gynecological examination (general) (routine) without abnormal findings: Secondary | ICD-10-CM | POA: Diagnosis not present

## 2021-10-28 DIAGNOSIS — Z3041 Encounter for surveillance of contraceptive pills: Secondary | ICD-10-CM

## 2021-10-28 DIAGNOSIS — Z1151 Encounter for screening for human papillomavirus (HPV): Secondary | ICD-10-CM | POA: Insufficient documentation

## 2021-10-28 DIAGNOSIS — R87610 Atypical squamous cells of undetermined significance on cytologic smear of cervix (ASC-US): Secondary | ICD-10-CM | POA: Diagnosis present

## 2021-10-28 DIAGNOSIS — D219 Benign neoplasm of connective and other soft tissue, unspecified: Secondary | ICD-10-CM | POA: Insufficient documentation

## 2021-10-28 MED ORDER — NORGESTIMATE-ETH ESTRADIOL 0.25-35 MG-MCG PO TABS: 1.0000 | ORAL_TABLET | Freq: Every day | ORAL | 3 refills | Status: DC

## 2021-10-28 NOTE — Patient Instructions (Signed)
I value your feedback and you entrusting us with your care. If you get a [facility] patient survey, I would appreciate you taking the time to let us know about your experience today. Thank you! ? ? ?

## 2021-11-02 LAB — CYTOLOGY - PAP
Comment: NEGATIVE
Diagnosis: NEGATIVE
High risk HPV: NEGATIVE

## 2022-04-22 ENCOUNTER — Other Ambulatory Visit: Payer: Self-pay | Admitting: Infectious Diseases

## 2022-04-22 DIAGNOSIS — Z6841 Body Mass Index (BMI) 40.0 and over, adult: Secondary | ICD-10-CM

## 2022-04-22 DIAGNOSIS — R7989 Other specified abnormal findings of blood chemistry: Secondary | ICD-10-CM

## 2022-05-02 ENCOUNTER — Ambulatory Visit: Payer: BC Managed Care – PPO

## 2022-05-05 ENCOUNTER — Ambulatory Visit
Admission: RE | Admit: 2022-05-05 | Discharge: 2022-05-05 | Disposition: A | Discharge status: Home / Self Care | Payer: BC Managed Care – PPO | Source: Ambulatory Visit | Attending: Infectious Diseases | Admitting: Infectious Diseases

## 2022-05-05 DIAGNOSIS — R7989 Other specified abnormal findings of blood chemistry: Secondary | ICD-10-CM | POA: Insufficient documentation

## 2022-05-05 DIAGNOSIS — Z6841 Body Mass Index (BMI) 40.0 and over, adult: Secondary | ICD-10-CM | POA: Diagnosis present

## 2022-09-28 ENCOUNTER — Other Ambulatory Visit: Payer: Self-pay | Admitting: Obstetrics and Gynecology

## 2022-09-28 DIAGNOSIS — Z3041 Encounter for surveillance of contraceptive pills: Secondary | ICD-10-CM

## 2022-10-04 ENCOUNTER — Other Ambulatory Visit: Payer: Self-pay

## 2022-10-04 DIAGNOSIS — Z3041 Encounter for surveillance of contraceptive pills: Secondary | ICD-10-CM

## 2022-10-04 MED ORDER — NORGESTIMATE-ETH ESTRADIOL 0.25-35 MG-MCG PO TABS: 1.0000 | ORAL_TABLET | Freq: Every day | ORAL | 0 refills | Status: DC

## 2022-10-04 NOTE — Telephone Encounter (Signed)
Fax request sent in from [facility] [facility] for refill on Sprintec 28. Patient last office visit was 10/28/21 and medication last filled 10/28/21.  Patient will be do for follow up after 10/28/22, will send in refill today.

## 2022-10-19 ENCOUNTER — Other Ambulatory Visit: Payer: Self-pay | Admitting: Obstetrics and Gynecology

## 2022-11-16 NOTE — H&P (Signed)
Preoperative History and Physical   Paige Perez is a 42 y.o. [id number] here for surgical management of menorrhagia with irregular cycle, fibroid uterus, and dysmenorrhea.   No significant preoperative concerns.   History of Present Illness: Paige Perez is a 42 y.o. [id number] who LMP was 10/05/2022, presents today for her preop examination.  Her menses are regular every 28-30 days, lasting 7 day(s).  Dysmenorrhea severe, occurring  days 2-3 . She does have intermenstrual bleeding. She wears all the time due to unexpected bleeding.   She is sexually active.   Last Pap: 10/28/2021  Results were: no abnormalities /neg HPV DNA negative 09/2017: LGSIL 09/2017: colposcopy CIN 1 10/2018: NILM, HPV negative 08/2020: ASCUS, HPV negative 10/2021: NILM, HPV negative Hx of STDs: none   She wants a hysterectomy.  She thinks she might have another fibroid. She has heavy bleeding again. She is spotting between periods. She has bad cramping.  She sometimes has milder, but uncomfortable, cramps between periods.     Ultrasound shows a cervical fibroid measuring 5.1 x 2.8 x 3.4 cm and an anterior fibroid measuring 4.4 x 4.6 x 5 cm. Otherwise, her ultrasound appears normal.      Proposed surgery: Robot assisted total laparoscopic hysterectomy, bilateral salpingectomy, cystoscopy         Past Medical History:  Diagnosis Date   Anemia     ASCUS of cervix with negative high risk HPV 08/11/2020   Elevated serum creatinine 05/2022   Hypertension     Leiomyoma 10/2021   LGSIL on Pap smear of cervix 07/14/2017    s/p colpo- CIN 1   Mass of cervix 10/2015    s/p cervical mass removal   Menorrhagia with irregular cycle 10/2018   Morbid obesity with BMI of 40.0-44.9, adult (CMS-[facility]) 07/2017   Submucous uterine fibroid 12/2018    seen on [place]: Within the endometrial canal there is a solid mass with blood flow  measuring 2.0cm   Uterine polyp           Past Surgical History:  Procedure  Laterality Date   DILATATION & CURETTAGE/HYSTEROSCOPY WITH MYOSURE WITH REMOVAL OF CERVICAL MASS   11/06/2015    Dr. Prentice Docker   COLPOSCOPY   09/08/2017    CIN 1   DILATATION AND CURETTAGE /HYSTEROSCOPY - REMOVAL OF ENDOMETRIAL MASS   03/21/2019    Dr. Prentice Docker   CESAREAN SECTION       TONSILLECTOMY        age 69                     OB History  Gravida Para Term Preterm AB Living  2 2 2     2  $ SAB IAB Ectopic Molar Multiple Live Births             2     # Outcome Date GA Lbr Len/2nd Weight Sex Delivery Anes PTL Lv  2 Term           CS-LTranv     LIV  1 Term                 LIV  Patient denies any other pertinent gynecologic issues.          Current Outpatient Medications on File Prior to Visit  Medication Sig Dispense Refill   cholecalciferol (VITAMIN D3) 1000 unit tablet Take by mouth       ferrous sulfate 325 (65 FE) MG EC tablet  Take 325 mg by mouth daily with breakfast       hydroCHLOROthiazide (HYDRODIURIL) 12.5 MG tablet Take 1 tablet (12.5 mg total) by mouth once daily 90 tablet 3   losartan (COZAAR) 25 MG tablet Take 1 tablet (25 mg total) by mouth once daily 90 tablet 3   norgestimate-ethinyl estradioL (SPRINTEC 0.25/35, 28,) 0.25-35 mg-mcg tablet Take 1 tablet by mouth once daily 84 tablet 3    No current facility-administered medications on file prior to visit.    No Known Allergies   Social History:   reports that she has never smoked. She has never used smokeless tobacco. She reports that she does not drink alcohol and does not use drugs.        Family History  Problem Relation Age of Onset   Seizures Mother     Diabetes type II Maternal Grandmother        Review of Systems: Noncontributory   PHYSICAL EXAM: Blood pressure (!) 161/91, pulse 82, weight (!) 138.6 kg (305 lb 9.6 oz). CONSTITUTIONAL: Well-developed, well-nourished female in no acute distress.  HENT:  Normocephalic, atraumatic, External right and left ear normal. Oropharynx is  clear and moist EYES: Conjunctivae and EOM are normal. Pupils are equal, round, and reactive to light. No scleral icterus.  NECK: Normal range of motion, supple, no masses SKIN: Skin is warm and dry. No rash noted. Not diaphoretic. No erythema. No pallor. [place]: Alert and oriented to person, place, and time. Normal reflexes, muscle tone coordination. No cranial nerve deficit noted. PSYCHIATRIC: Normal mood and affect. Normal behavior. Normal judgment and thought content. CARDIOVASCULAR: Normal heart rate noted, regular rhythm RESPIRATORY: Effort and breath sounds normal, no problems with respiration noted ABDOMEN: Soft, nontender, nondistended. PELVIC: Deferred MUSCULOSKELETAL: Normal range of motion. No edema and no tenderness. 2+ distal pulses.   Labs: Recent Results  No results found for this or any previous visit (from the past 336 hour(s)).     Imaging Studies: US pelvic transvaginal   Result Date: 11/16/2022 ULTRASOUND REPORT Location: [facility] Date of Service: 11/16/2022 Indications: Menorrhagia and dysmenorrhea Findings: The uterus is anteverted and measures 10.45 x 6.5 x 6.8 cm. Echo texture is heterogenous with evidence of focal masses. Within the uterus are multiple suspected fibroids measuring: Fibroid 1: measures 5.1 x 2.8 x 3.4 cm located within the cervix extending proximally into lower uterine segment. Fibroid 2: measures 4.4 x 6.5 x 6.8 cm, anterior The Endometrium measures 5.74 mm. Right Ovary measures 2.2 x 1.6 x 1.5 cm. It is normal in appearance. Left Ovary measures 1.9 x 1.9 x 1.5 cm. It is normal in appearance. Survey of the adnexa demonstrates no adnexal masses. There is no free fluid in the cul de sac. Impression: 1. Mass within the uterus, appearing most consistent with a fibroid. She has a history of the same in the this location. 2. Anterior fibroid 3. Otherwise, normal gynecologic pelvic ultrasound The ultrasound images and findings were reviewed by me and  I agree with the above report. Clare Charon, MD [place] 11/16/2022 2:22 PM         Assessment:    Patient Active Problem List  Diagnosis   Leiomyoma   Menorrhagia with irregular cycle      Plan: Patient will undergo surgical management with the above-noted surgery.   The risks of surgery were discussed in detail with the patient including but not limited to: bleeding which may require transfusion or reoperation;  infection which may require antibiotics; injury to surrounding organs which may involve bowel, bladder, ureters ; need for additional procedures including laparoscopy or laparotomy; thromboembolic phenomenon, surgical site problems and other postoperative/anesthesia complications. Likelihood of success in alleviating the patient's condition was discussed. Routine postoperative instructions will be reviewed with the patient and her family in detail after surgery.  The patient concurred with the proposed plan, giving informed written consent for the surgery.   Preoperative prophylactic antibiotics, as indicated, and SCDs ordered on call to the OR.       Attestation Statement:    I personally performed the service. (TP)   [place], MD  [place] 11/16/2022 2:24 PM

## 2022-11-25 ENCOUNTER — Other Ambulatory Visit: Payer: BC Managed Care – PPO

## 2022-12-01 ENCOUNTER — Encounter
Admission: RE | Admit: 2022-12-01 | Discharge: 2022-12-01 | Disposition: A | Discharge status: Home / Self Care | Payer: BC Managed Care – PPO | Source: Ambulatory Visit | Attending: Obstetrics and Gynecology | Admitting: Obstetrics and Gynecology

## 2022-12-01 VITALS — Ht 66.0 in | Wt 305.0 lb

## 2022-12-01 DIAGNOSIS — I10 Essential (primary) hypertension: Secondary | ICD-10-CM

## 2022-12-01 DIAGNOSIS — Z01812 Encounter for preprocedural laboratory examination: Secondary | ICD-10-CM

## 2022-12-01 HISTORY — DX: Leiomyoma of uterus, unspecified: D25.9

## 2022-12-01 HISTORY — DX: Essential (primary) hypertension: I10

## 2022-12-01 NOTE — Patient Instructions (Addendum)
Your procedure is scheduled on: Friday, March 8 Report to the Registration Desk on the 1st floor of the [facility]. To find out your arrival time, please call [phone] between 1PM - 3PM on: Thursday, March 7 If your arrival time is 6:00 am, do not arrive before that time as the [place] entrance doors do not open until 6:00 am.  REMEMBER: Instructions that are not followed completely may result in serious medical risk, up to and including death; or upon the discretion of your surgeon and anesthesiologist your surgery may need to be rescheduled.  Do not eat or drink after midnight the night before surgery.  No gum chewing or hard candies.  One week prior to surgery: starting March 1 Stop Anti-inflammatories (NSAIDS) such as Advil, Aleve, Ibuprofen, Motrin, Naproxen, Naprosyn and Aspirin based products such as Excedrin, Goody's Powder, BC Powder. Stop ANY OVER THE COUNTER supplements until after surgery. You may however, continue to take Tylenol if needed for pain up until the day of surgery.  Continue taking all prescribed medications    DO NOT TAKE ANY MEDICATIONS THE MORNING OF SURGERY  No Alcohol for 24 hours before or after surgery.  No Smoking including e-cigarettes for 24 hours before surgery.  No chewable tobacco products for at least 6 hours before surgery.  No nicotine patches on the day of surgery.  Do not use any "recreational" drugs for at least a week (preferably 2 weeks) before your surgery.  Please be advised that the combination of cocaine and anesthesia may have negative outcomes, up to and including death. If you test positive for cocaine, your surgery will be cancelled.  On the morning of surgery brush your teeth with toothpaste and water, you may rinse your mouth with mouthwash if you wish. Do not swallow any toothpaste or mouthwash.  Use CHG Soap as directed on instruction sheet.  Do not wear jewelry, make-up, hairpins, clips or nail polish.  Do  not wear lotions, powders, or perfumes.   Do not shave body hair from the neck down 48 hours before surgery.  Contact lenses, hearing aids and dentures may not be worn into surgery.  Do not bring valuables to the hospital. [facility] is not responsible for any missing/lost belongings or valuables.   Notify your doctor if there is any change in your medical condition (cold, fever, infection).  Wear comfortable clothing (specific to your surgery type) to the hospital.  After surgery, you can help prevent lung complications by doing breathing exercises.  Take deep breaths and cough every 1-2 hours. Your doctor may order a device called an Incentive Spirometer to help you take deep breaths. When coughing or sneezing, hold a pillow firmly against your incision with both hands. This is called "splinting." Doing this helps protect your incision. It also decreases belly discomfort.  If you are being discharged the day of surgery, you will not be allowed to drive home. You will need a responsible individual to drive you home and stay with you for 24 hours after surgery.   If you are taking public transportation, you will need to have a responsible individual with you.  Please call the [facility] at [phone] if you have any questions about these instructions.  Surgery Visitation Policy:  Patients undergoing a surgery or procedure may have two family members or support persons with them as long as the person is not COVID-19 positive or experiencing its symptoms.      Preparing for Surgery  with CHLORHEXIDINE GLUCONATE (CHG) Soap  Chlorhexidine Gluconate (CHG) Soap  o An antiseptic cleaner that kills germs and bonds with the skin to continue killing germs even after washing  o Used for showering the night before surgery and morning of surgery  Before surgery, you can play an important role by reducing the number of germs on your skin.  CHG (Chlorhexidine gluconate)  soap is an antiseptic cleanser which kills germs and bonds with the skin to continue killing germs even after washing.  Please do not use if you have an allergy to CHG or antibacterial soaps. If your skin becomes reddened/irritated stop using the CHG.  1. Shower the NIGHT BEFORE SURGERY and the MORNING OF SURGERY with CHG soap.  2. If you choose to wash your hair, wash your hair first as usual with your normal shampoo.  3. After shampooing, rinse your hair and body thoroughly to remove the shampoo.  4. Use CHG as you would any other liquid soap. You can apply CHG directly to the skin and wash gently with a scrungie or a clean washcloth.  5. Apply the CHG soap to your body only from the neck down. Do not use on open wounds or open sores. Avoid contact with your eyes, ears, mouth, and genitals (private parts). Wash face and genitals (private parts) with your normal soap.  6. Wash thoroughly, paying special attention to the area where your surgery will be performed.  7. Thoroughly rinse your body with warm water.  8. Do not shower/wash with your normal soap after using and rinsing off the CHG soap.  9. Pat yourself dry with a clean towel.  10. Wear clean pajamas to bed the night before surgery.  12. Place clean sheets on your bed the night of your first shower and do not sleep with pets.  13. Shower again with the CHG soap on the day of surgery prior to arriving at the hospital.  14. Do not apply any deodorants/lotions/powders.  15. Please wear clean clothes to the hospital.

## 2022-12-02 ENCOUNTER — Encounter
Admission: RE | Admit: 2022-12-02 | Discharge: 2022-12-02 | Disposition: A | Discharge status: Home / Self Care | Payer: BC Managed Care – PPO | Source: Ambulatory Visit | Attending: Obstetrics and Gynecology | Admitting: Obstetrics and Gynecology

## 2022-12-02 DIAGNOSIS — D219 Benign neoplasm of connective and other soft tissue, unspecified: Secondary | ICD-10-CM | POA: Diagnosis not present

## 2022-12-02 DIAGNOSIS — Z01818 Encounter for other preprocedural examination: Secondary | ICD-10-CM | POA: Insufficient documentation

## 2022-12-02 DIAGNOSIS — N921 Excessive and frequent menstruation with irregular cycle: Secondary | ICD-10-CM | POA: Diagnosis not present

## 2022-12-02 DIAGNOSIS — I10 Essential (primary) hypertension: Secondary | ICD-10-CM | POA: Insufficient documentation

## 2022-12-02 DIAGNOSIS — Z01812 Encounter for preprocedural laboratory examination: Secondary | ICD-10-CM

## 2022-12-02 LAB — BASIC METABOLIC PANEL
Anion gap: 10 (ref 5–15)
BUN: 17 mg/dL (ref 6–20)
CO2: 26 mmol/L (ref 22–32)
Calcium: 8.9 mg/dL (ref 8.9–10.3)
Chloride: 102 mmol/L (ref 98–111)
Creatinine, Ser: 1.26 mg/dL — ABNORMAL HIGH (ref 0.44–1.00)
GFR, Estimated: 56 mL/min — ABNORMAL LOW (ref >60)
Glucose, Bld: 93 mg/dL (ref 70–99)
Potassium: 3.2 mmol/L — ABNORMAL LOW (ref 3.5–5.1)
Sodium: 138 mmol/L (ref 135–145)

## 2022-12-02 LAB — CBC
HCT: 36.7 % (ref 36.0–46.0)
Hemoglobin: 11.5 g/dL — ABNORMAL LOW (ref 12.0–15.0)
MCH: 28.2 pg (ref 26.0–34.0)
MCHC: 31.3 g/dL (ref 30.0–36.0)
MCV: 90 fL (ref 80.0–100.0)
Platelets: 252 10*3/uL (ref 150–400)
RBC: 4.08 MIL/uL (ref 3.87–5.11)
RDW: 12.9 % (ref 11.5–15.5)
WBC: 6.6 10*3/uL (ref 4.0–10.5)
nRBC: 0 % (ref 0.0–0.2)

## 2022-12-02 LAB — TYPE AND SCREEN
ABO/RH(D): A POS
Antibody Screen: NEGATIVE

## 2022-12-09 ENCOUNTER — Encounter
Admission: RE | Disposition: A | Discharge status: Home / Self Care | Payer: Self-pay | Source: Ambulatory Visit | Attending: Obstetrics and Gynecology

## 2022-12-09 ENCOUNTER — Other Ambulatory Visit: Payer: Self-pay

## 2022-12-09 ENCOUNTER — Encounter: Payer: Self-pay | Admitting: Obstetrics and Gynecology

## 2022-12-09 ENCOUNTER — Ambulatory Visit
Admission: RE | Admit: 2022-12-09 | Discharge: 2022-12-09 | Disposition: A | Discharge status: Home / Self Care | Payer: BC Managed Care – PPO | Source: Ambulatory Visit | Attending: Obstetrics and Gynecology | Admitting: Obstetrics and Gynecology

## 2022-12-09 ENCOUNTER — Ambulatory Visit: Payer: BC Managed Care – PPO | Admitting: Registered Nurse

## 2022-12-09 ENCOUNTER — Ambulatory Visit: Payer: BC Managed Care – PPO | Admitting: Urgent Care

## 2022-12-09 DIAGNOSIS — N921 Excessive and frequent menstruation with irregular cycle: Secondary | ICD-10-CM | POA: Diagnosis present

## 2022-12-09 DIAGNOSIS — D219 Benign neoplasm of connective and other soft tissue, unspecified: Secondary | ICD-10-CM

## 2022-12-09 DIAGNOSIS — Z79899 Other long term (current) drug therapy: Secondary | ICD-10-CM | POA: Insufficient documentation

## 2022-12-09 DIAGNOSIS — D250 Submucous leiomyoma of uterus: Secondary | ICD-10-CM | POA: Diagnosis not present

## 2022-12-09 DIAGNOSIS — N946 Dysmenorrhea, unspecified: Secondary | ICD-10-CM | POA: Diagnosis not present

## 2022-12-09 DIAGNOSIS — D251 Intramural leiomyoma of uterus: Secondary | ICD-10-CM | POA: Insufficient documentation

## 2022-12-09 DIAGNOSIS — Z6841 Body Mass Index (BMI) 40.0 and over, adult: Secondary | ICD-10-CM | POA: Diagnosis not present

## 2022-12-09 DIAGNOSIS — I10 Essential (primary) hypertension: Secondary | ICD-10-CM | POA: Insufficient documentation

## 2022-12-09 DIAGNOSIS — D259 Leiomyoma of uterus, unspecified: Secondary | ICD-10-CM | POA: Diagnosis present

## 2022-12-09 DIAGNOSIS — Z01812 Encounter for preprocedural laboratory examination: Secondary | ICD-10-CM

## 2022-12-09 HISTORY — PX: ROBOTIC ASSISTED LAPAROSCOPIC HYSTERECTOMY AND SALPINGECTOMY: SHX6379

## 2022-12-09 HISTORY — PX: CYSTOSCOPY: SHX5120

## 2022-12-09 LAB — POCT PREGNANCY, URINE: Preg Test, Ur: NEGATIVE

## 2022-12-09 LAB — ABO/RH: ABO/RH(D): A POS

## 2022-12-09 SURGERY — XI ROBOTIC ASSISTED LAPAROSCOPIC HYSTERECTOMY AND SALPINGECTOMY
Anesthesia: General | Site: Pelvis

## 2022-12-09 MED ORDER — 0.9 % SODIUM CHLORIDE (POUR BTL) OPTIME
TOPICAL | Status: DC | PRN
  Administered 2022-12-09: 500 mL

## 2022-12-09 MED ORDER — ONDANSETRON HCL 4 MG/2ML IJ SOLN
INTRAMUSCULAR | Status: AC
  Filled 2022-12-09: qty 2

## 2022-12-09 MED ORDER — PROPOFOL 10 MG/ML IV BOLUS
INTRAVENOUS | Status: AC
  Filled 2022-12-09: qty 40

## 2022-12-09 MED ORDER — CEFAZOLIN IN SODIUM CHLORIDE 3-0.9 GM/100ML-% IV SOLN
3.0000 g | INTRAVENOUS | Status: AC
  Administered 2022-12-09: 3 g via INTRAVENOUS
  Filled 2022-12-09: qty 100

## 2022-12-09 MED ORDER — IBUPROFEN 600 MG PO TABS: 600.0000 mg | ORAL_TABLET | Freq: Four times a day (QID) | ORAL | 0 refills | Status: AC

## 2022-12-09 MED ORDER — SODIUM CHLORIDE 0.9 % IV SOLN: INTRAVENOUS | Status: DC

## 2022-12-09 MED ORDER — SUGAMMADEX SODIUM 500 MG/5ML IV SOLN
INTRAVENOUS | Status: DC | PRN
  Administered 2022-12-09: 200 mg via INTRAVENOUS

## 2022-12-09 MED ORDER — LACTATED RINGERS IV SOLN: INTRAVENOUS | Status: DC

## 2022-12-09 MED ORDER — ROCURONIUM BROMIDE 10 MG/ML (PF) SYRINGE
PREFILLED_SYRINGE | INTRAVENOUS | Status: AC
  Filled 2022-12-09: qty 10

## 2022-12-09 MED ORDER — FAMOTIDINE 20 MG PO TABS: 20.0000 mg | ORAL_TABLET | Freq: Once | ORAL | Status: DC

## 2022-12-09 MED ORDER — DEXAMETHASONE SODIUM PHOSPHATE 10 MG/ML IJ SOLN
INTRAMUSCULAR | Status: DC | PRN
  Administered 2022-12-09: 10 mg via INTRAVENOUS

## 2022-12-09 MED ORDER — LIDOCAINE HCL (PF) 2 % IJ SOLN
INTRAMUSCULAR | Status: AC
  Filled 2022-12-09: qty 5

## 2022-12-09 MED ORDER — ONDANSETRON HCL 4 MG/2ML IJ SOLN
INTRAMUSCULAR | Status: DC | PRN
  Administered 2022-12-09: 4 mg via INTRAVENOUS

## 2022-12-09 MED ORDER — CHLORHEXIDINE GLUCONATE 0.12 % MT SOLN: 15.0000 mL | Freq: Once | OROMUCOSAL | Status: AC

## 2022-12-09 MED ORDER — FLUORESCEIN SODIUM 10 % IV SOLN
INTRAVENOUS | Status: DC | PRN
  Administered 2022-12-09: 100 mg via INTRAVENOUS

## 2022-12-09 MED ORDER — LIDOCAINE HCL (CARDIAC) PF 100 MG/5ML IV SOSY
PREFILLED_SYRINGE | INTRAVENOUS | Status: DC | PRN
  Administered 2022-12-09: 100 mg via INTRAVENOUS

## 2022-12-09 MED ORDER — BUPIVACAINE HCL (PF) 0.5 % IJ SOLN
INTRAMUSCULAR | Status: AC
  Filled 2022-12-09: qty 30

## 2022-12-09 MED ORDER — CHLORHEXIDINE GLUCONATE 0.12 % MT SOLN
OROMUCOSAL | Status: AC
  Administered 2022-12-09: 15 mL via OROMUCOSAL
  Filled 2022-12-09: qty 15

## 2022-12-09 MED ORDER — POVIDONE-IODINE 10 % EX SWAB: 2.0000 | Freq: Once | CUTANEOUS | Status: DC

## 2022-12-09 MED ORDER — ACETAMINOPHEN 10 MG/ML IV SOLN
INTRAVENOUS | Status: AC
  Filled 2022-12-09: qty 100

## 2022-12-09 MED ORDER — MIDAZOLAM HCL 2 MG/2ML IJ SOLN
INTRAMUSCULAR | Status: AC
  Filled 2022-12-09: qty 2

## 2022-12-09 MED ORDER — VASOPRESSIN 20 UNIT/ML IV SOLN
INTRAVENOUS | Status: AC
  Filled 2022-12-09: qty 1

## 2022-12-09 MED ORDER — MIDAZOLAM HCL 2 MG/2ML IJ SOLN
INTRAMUSCULAR | Status: DC | PRN
  Administered 2022-12-09: 2 mg via INTRAVENOUS

## 2022-12-09 MED ORDER — ACETAMINOPHEN 10 MG/ML IV SOLN
INTRAVENOUS | Status: DC | PRN
  Administered 2022-12-09: 1000 mg via INTRAVENOUS

## 2022-12-09 MED ORDER — GLYCOPYRROLATE 0.2 MG/ML IJ SOLN
INTRAMUSCULAR | Status: DC | PRN
  Administered 2022-12-09: .2 mg via INTRAVENOUS

## 2022-12-09 MED ORDER — CEFAZOLIN SODIUM-DEXTROSE 2-4 GM/100ML-% IV SOLN
INTRAVENOUS | Status: AC
  Filled 2022-12-09: qty 100

## 2022-12-09 MED ORDER — CEFAZOLIN SODIUM 1 G IJ SOLR
INTRAMUSCULAR | Status: AC
  Filled 2022-12-09: qty 10

## 2022-12-09 MED ORDER — ONDANSETRON HCL 4 MG/2ML IJ SOLN
4.0000 mg | Freq: Once | INTRAMUSCULAR | Status: AC | PRN
  Administered 2022-12-09: 4 mg via INTRAVENOUS

## 2022-12-09 MED ORDER — FLUORESCEIN SODIUM 10 % IV SOLN
INTRAVENOUS | Status: AC
  Filled 2022-12-09: qty 5

## 2022-12-09 MED ORDER — PROPOFOL 10 MG/ML IV BOLUS
INTRAVENOUS | Status: DC | PRN
  Administered 2022-12-09: 170 mg via INTRAVENOUS

## 2022-12-09 MED ORDER — ROCURONIUM BROMIDE 100 MG/10ML IV SOLN
INTRAVENOUS | Status: DC | PRN
  Administered 2022-12-09 (×3): 20 mg via INTRAVENOUS
  Administered 2022-12-09: 10 mg via INTRAVENOUS
  Administered 2022-12-09: 50 mg via INTRAVENOUS
  Administered 2022-12-09: 10 mg via INTRAVENOUS

## 2022-12-09 MED ORDER — FENTANYL CITRATE (PF) 100 MCG/2ML IJ SOLN
INTRAMUSCULAR | Status: AC
  Filled 2022-12-09: qty 2

## 2022-12-09 MED ORDER — FENTANYL CITRATE (PF) 100 MCG/2ML IJ SOLN: 25.0000 ug | INTRAMUSCULAR | Status: DC | PRN

## 2022-12-09 MED ORDER — SOD CITRATE-CITRIC ACID 500-334 MG/5ML PO SOLN
30.0000 mL | ORAL | Status: DC
  Filled 2022-12-09: qty 30

## 2022-12-09 MED ORDER — FENTANYL CITRATE (PF) 100 MCG/2ML IJ SOLN
INTRAMUSCULAR | Status: DC | PRN
  Administered 2022-12-09 (×2): 50 ug via INTRAVENOUS
  Administered 2022-12-09: 25 ug via INTRAVENOUS
  Administered 2022-12-09: 50 ug via INTRAVENOUS
  Administered 2022-12-09: 25 ug via INTRAVENOUS

## 2022-12-09 MED ORDER — ONDANSETRON 4 MG PO TBDP: 4.0000 mg | ORAL_TABLET | Freq: Four times a day (QID) | ORAL | 0 refills | Status: AC | PRN

## 2022-12-09 MED ORDER — OXYCODONE-ACETAMINOPHEN 5-325 MG PO TABS: 1.0000 | ORAL_TABLET | Freq: Four times a day (QID) | ORAL | 0 refills | Status: AC | PRN

## 2022-12-09 MED ORDER — DEXMEDETOMIDINE HCL IN NACL 80 MCG/20ML IV SOLN
INTRAVENOUS | Status: DC | PRN
  Administered 2022-12-09 (×3): 4 ug via INTRAVENOUS

## 2022-12-09 MED ORDER — ORAL CARE MOUTH RINSE: 15.0000 mL | Freq: Once | OROMUCOSAL | Status: AC

## 2022-12-09 SURGICAL SUPPLY — 71 items
ADH SKN CLS APL DERMABOND .7 (GAUZE/BANDAGES/DRESSINGS) ×2
BAG DRN RND TRDRP ANRFLXCHMBR (UROLOGICAL SUPPLIES) ×2
BAG PRESSURE INF DISP 1000 (BAG) IMPLANT
BAG PRSS INFS OVL BLB 1000 (BAG) ×2
BAG URINE DRAIN 2000ML AR STRL (UROLOGICAL SUPPLIES) ×2 IMPLANT
BASIN GRAD PLASTIC 32OZ STRL (MISCELLANEOUS) IMPLANT
BASIN KIT SINGLE STR (MISCELLANEOUS) ×2 IMPLANT
BLADE SURG 15 STRL LF DISP TIS (BLADE) ×2 IMPLANT
BLADE SURG 15 STRL SS (BLADE) ×2
BLADE SURG SZ11 CARB STEEL (BLADE) ×2 IMPLANT
CANNULA CAP OBTURATR AIRSEAL 8 (CAP) ×2 IMPLANT
CATH FOLEY 2WAY  5CC 16FR (CATHETERS) ×2
CATH FOLEY 2WAY 5CC 16FR (CATHETERS) ×2
CATH URTH 16FR FL 2W BLN LF (CATHETERS) ×2 IMPLANT
COVER TIP SHEARS 8 DVNC (MISCELLANEOUS) ×2 IMPLANT
COVER TIP SHEARS 8MM DA VINCI (MISCELLANEOUS) ×2
DERMABOND ADVANCED .7 DNX12 (GAUZE/BANDAGES/DRESSINGS) ×2 IMPLANT
DRAPE 3/4 80X56 (DRAPES) IMPLANT
DRAPE ARM DVNC X/XI (DISPOSABLE) ×8 IMPLANT
DRAPE DA VINCI XI ARM (DISPOSABLE) ×8
DRAPE ROBOT W/ LEGGING 30X125 (DRAPES) ×2 IMPLANT
DRAPE UNDER BUTTOCK W/FLU (DRAPES) ×2 IMPLANT
ELECT REM PT RETURN 9FT ADLT (ELECTROSURGICAL) ×2
ELECTRODE REM PT RTRN 9FT ADLT (ELECTROSURGICAL) ×2 IMPLANT
GAUZE 4X4 16PLY ~~LOC~~+RFID DBL (SPONGE) ×2 IMPLANT
GLOVE BIO SURGEON STRL SZ7 (GLOVE) ×6 IMPLANT
GLOVE SURG UNDER POLY LF SZ7.5 (GLOVE) ×6 IMPLANT
GOWN STRL REUS W/ TWL LRG LVL3 (GOWN DISPOSABLE) ×6 IMPLANT
GOWN STRL REUS W/ TWL XL LVL3 (GOWN DISPOSABLE) ×2 IMPLANT
GOWN STRL REUS W/TWL LRG LVL3 (GOWN DISPOSABLE) ×6
GOWN STRL REUS W/TWL XL LVL3 (GOWN DISPOSABLE) ×2
IRRIGATOR SUCT 8 DISP DVNC XI (IRRIGATION / IRRIGATOR) IMPLANT
IRRIGATOR SUCTION 8MM XI DISP (IRRIGATION / IRRIGATOR) ×2
IV LACTATED RINGERS 1000ML (IV SOLUTION) ×2 IMPLANT
IV NS 1000ML (IV SOLUTION) ×2
IV NS 1000ML BAXH (IV SOLUTION) ×2 IMPLANT
KIT PINK PAD W/HEAD ARE REST (MISCELLANEOUS) ×2
KIT PINK PAD W/HEAD ARM REST (MISCELLANEOUS) ×2 IMPLANT
KIT TURNOVER CYSTO (KITS) ×2 IMPLANT
LABEL OR SOLS (LABEL) ×2 IMPLANT
MANIFOLD NEPTUNE II (INSTRUMENTS) ×2 IMPLANT
MANIPULATOR VCARE LG CRV RETR (MISCELLANEOUS) IMPLANT
NEEDLE HYPO 22GX1.5 SAFETY (NEEDLE) ×2 IMPLANT
NS IRRIG 500ML POUR BTL (IV SOLUTION) ×2 IMPLANT
OBTURATOR OPTICAL STANDARD 8MM (TROCAR) ×2
OBTURATOR OPTICAL STND 8 DVNC (TROCAR) ×2
OBTURATOR OPTICALSTD 8 DVNC (TROCAR) ×2 IMPLANT
OCCLUDER COLPOPNEUMO (BALLOONS) ×2 IMPLANT
PACK LAP CHOLECYSTECTOMY (MISCELLANEOUS) ×2 IMPLANT
PAD PREP 24X41 OB/GYN DISP (PERSONAL CARE ITEMS) ×2 IMPLANT
SCRUB CHG 4% DYNA-HEX 4OZ (MISCELLANEOUS) ×2 IMPLANT
SEAL CANN UNIV 5-8 DVNC XI (MISCELLANEOUS) ×6 IMPLANT
SEAL XI 5MM-8MM UNIVERSAL (MISCELLANEOUS) ×6
SEALER VESSEL DA VINCI XI (MISCELLANEOUS) ×2
SEALER VESSEL EXT DVNC XI (MISCELLANEOUS) ×2 IMPLANT
SET CYSTO W/LG BORE CLAMP LF (SET/KITS/TRAYS/PACK) ×2 IMPLANT
SET TUBE FILTERED XL AIRSEAL (SET/KITS/TRAYS/PACK) ×2 IMPLANT
SOL ELECTROSURG ANTI STICK (MISCELLANEOUS) ×2
SOLUTION ELECTROSURG ANTI STCK (MISCELLANEOUS) ×2 IMPLANT
SURGILUBE 2OZ TUBE FLIPTOP (MISCELLANEOUS) ×2 IMPLANT
SUT DVC VLOC 180 0 12IN GS21 (SUTURE) ×2
SUT MNCRL 4-0 (SUTURE) ×2
SUT MNCRL 4-0 27XMFL (SUTURE) ×2
SUT VIC AB 0 CT2 27 (SUTURE) ×2 IMPLANT
SUT VLOC 90 S/L VL9 GS22 (SUTURE) ×2 IMPLANT
SUTURE DVC VLC 180 0 12IN GS21 (SUTURE) IMPLANT
SUTURE MNCRL 4-0 27XMF (SUTURE) ×2 IMPLANT
SYR 10ML LL (SYRINGE) ×2 IMPLANT
SYR 50ML LL SCALE MARK (SYRINGE) ×2 IMPLANT
TRAP FLUID SMOKE EVACUATOR (MISCELLANEOUS) ×2 IMPLANT
WATER STERILE IRR 500ML POUR (IV SOLUTION) ×2 IMPLANT

## 2022-12-09 NOTE — Interval H&P Note (Signed)
History and Physical Interval Note:  12/09/2022 2:54 PM  Paige Perez  has presented today for surgery, with the diagnosis of menorrhagia with irregular cycle.  The various methods of treatment have been discussed with the patient and family. After consideration of risks, benefits and other options for treatment, the patient has consented to  Procedure(s): XI ROBOTIC ASSISTED LAPAROSCOPIC HYSTERECTOMY AND SALPINGECTOMY (Bilateral) CYSTOSCOPY (N/A) as a surgical intervention.  The patient's history has been reviewed, patient examined, no change in status, stable for surgery.  I have reviewed the patient's chart and labs.  Questions were answered to the patient's satisfaction.  Consents reviewed and patient wishes to proceed.  Prentice Docker, MD, [facility] 12/09/2022 2:55 PM

## 2022-12-09 NOTE — Op Note (Signed)
Operative Note    Name: Paige Perez  Date of Service: 12/09/2022   DOB: 1981/08/10  MRN: [id number]    Pre-Operative Diagnosis:  1) Menorrhagia with irregular cycle 2) fibroid uterus  Post-Operative Diagnosis:  1) Menorrhagia with irregular cycle 2) fibroid uterus  Procedures:  1. Robot assisted Total Laparoscopic Hysterectomy, bilateral salpingectomy  2. Cystoscopy  Primary Surgeon: Prentice Docker, MD   EBL: 40 mL   IVF: 1,000 mL   Urine output: 300 mL  Specimens: Uterus, cervix, and bilateral fallopian tubes  Drains: none  Complications: None   Disposition: PACU   Condition: Stable   Findings:  1) enlarged uterus with apparent fundal, pedunculated fibroid, lower uterine segment fibroid versus polyp with an enlarged cervix. 2) normal appearing ovaries, bilateral fallopian tubes  Procedure Summary:  The patient was taken to the operating room where general anesthesia was administered and found to be adequate. She was placed in the dorsal supine lithotomy position in Lyons stirrups and prepped and draped in the usual, sterile fashion. After a timeout was called an indwelling catheter was placed in her bladder.  A sterile speculum was placed in her vagina.  The anterior lip of the cervix was grasped with the single-tooth tenaculum.  The cervix was serially dilated to an 11 Pratt dilator.  The large Vcare device was placed in accordance to the manufacturer's recommendations.  The tenaculum and speculum were removed.   Attention was turned to the abdomen where after injection of local anesthetic, an 8 mm infraumbilical incision was made with the scalpel. Entry into the abdomen was obtained via Optiview trocar technique (a blunt entry technique with camera visualization through the obturator upon entry). Verification of entry into the abdomen was obtained using opening pressures. The abdomen was insufflated with CO2. The camera was introduced through the trocar with  verification of atraumatic entry.  Right and left abdominal entry sites were created after injection of local anesthetic about 8 cm lateral to the umbilical port in accordance with the Intuitive manufacturer's recommendations.  An additional port was placed 8 cm lateral to the right abdominal port with verification of clearance above the iliac crest by more than 2 cm.  The port sites were 8 mm.  The intuitive trochars were introduced under intra-abdominal camera visualization without difficulty   The XI robot was docked on the patient's left.  Clearance was verified from the patient's legs.  Through the umbilical port the camera was placed.  Through the port attached to arm 3 the monopolar scissors were placed.  Through the port attached to arm 4 the forced bipolar forceps were was placed.  The vessel sealer was attached to port 1.   An inspection was undertaken of the pelvis with the above-noted findings. The bilateral ureters were identified and found to be well away from the operative area of interest. The right fallopian tube was grasped at the fimbriated end and was transected using the vessel sealer along the mesosalpinx in a lateral to medial fashion. The vessel sealer was used to transect the right round ligament and the utero-ovarian ligament was transected. Tissue was divided along the right broad ligament to the level of the interior cervical os. Extensive bladder tissue dissection was performed to get enlarged cervix free of bladder. The right uterine artery was skeletonized and identified and after ligation was transected with the Vessel Sealer device. The same procedure was carried out on the left side. The colpotomy was performed using monopolar electrocautery in a circumferential fashion  following the KOH ring.  The uterus and fallopian tubes and cervix were removed through the vagina.   Closure of the vaginal cuff was undertaken using the V-lock stitch in a running fashion.  All vascular  pedicles were inspected and found to be hemostatic.  All instruments removed from the robotic ports.  The robot was undocked from the patient.  The abdomen was then desufflated of CO2 with the aid of 5 deep breaths from anesthesia.  All trochars were then removed.  All skin incisions were closed using 4-0 Vicryl in a subcuticular fashion and reinforced using surgical skin glue.   Cystoscopy was undertaken at this point. The Foley catheter was removed and the 70 cystoscope was gently introduced through the urethra. The bladder survey was undertaken with efflux of urine from both orifices noted. There were no defects noted in the bladder wall. The cystoscope was utilized to fully empty the bladder.  A digital sweep of the vagina was undertaken to ensure no instruments or sponges remained and this was verified to be clear.  The patient tolerated the procedure well.  Sponge, lap, needle, and instrument counts were correct x 2.  VTE prophylaxis: SCDs. Antibiotic prophylaxis: Ancef 2 grams IV prior to skin incision. She was awakened in the operating room and was taken to the PACU in stable condition.   Prentice Docker, MD, [facility] 12/09/2022 6:47 PM

## 2022-12-09 NOTE — Transfer of Care (Signed)
Immediate Anesthesia Transfer of Care Note  Patient: Paige Perez  Procedure(s) Performed: XI ROBOTIC ASSISTED LAPAROSCOPIC HYSTERECTOMY AND SALPINGECTOMY (Bilateral: Pelvis) CYSTOSCOPY (Bladder)  Patient Location: PACU  Anesthesia Type:General  Level of Consciousness: drowsy  Airway & Oxygen Therapy: Patient Spontanous Breathing and Patient connected to face mask oxygen  Post-op Assessment: Report given to RN and Post -op Vital signs reviewed and stable  Post vital signs: Reviewed and stable  Last Vitals:  Vitals Value Taken Time  BP 146/77   Temp    Pulse 82 12/09/22 1848  Resp 7 12/09/22 1848  SpO2 96 % 12/09/22 1848  Vitals shown include unvalidated device data.  Last Pain:  Vitals:   12/09/22 0945  TempSrc: Temporal  PainSc: 0-No pain      Patients Stated Pain Goal: 0 ([id number])  Complications: No notable events documented.

## 2022-12-09 NOTE — Anesthesia Procedure Notes (Signed)
Procedure Name: Intubation Date/Time: 12/09/2022 3:11 PM  Performed by: Lily Peer, Jhanvi Drakeford, CRNAPre-anesthesia Checklist: Patient identified, Emergency Drugs available, Suction available and Patient being monitored Patient Re-evaluated:Patient Re-evaluated prior to induction Oxygen Delivery Method: Circle system utilized Preoxygenation: Pre-oxygenation with 100% oxygen Induction Type: IV induction Ventilation: Mask ventilation without difficulty Laryngoscope Size: McGraph and 3 Grade View: Grade I Tube type: Oral Tube size: 7.0 mm Number of attempts: 1 Airway Equipment and Method: Stylet Placement Confirmation: ETT inserted through vocal cords under direct vision, positive ETCO2 and breath sounds checked- equal and bilateral Secured at: 18 cm Tube secured with: Tape Dental Injury: Teeth and Oropharynx as per pre-operative assessment

## 2022-12-09 NOTE — Progress Notes (Signed)
Patient states "I have to pee". Upon going to get the bedpan patient urinated on peripad. And it is soaked. Three RN's bladder scanned this patient and bladder scan results as 0 ml.

## 2022-12-09 NOTE — Discharge Instructions (Signed)

## 2022-12-09 NOTE — Anesthesia Preprocedure Evaluation (Addendum)
Anesthesia Evaluation  Patient identified by MRN, date of birth, ID band Patient awake    Reviewed: Allergy & Precautions, NPO status , Patient's Chart, lab work & pertinent test results  Airway Mallampati: III  TM Distance: >3 FB Neck ROM: Full    Dental  (+) Teeth Intact   Pulmonary neg pulmonary ROS   Pulmonary exam normal breath sounds clear to auscultation       Cardiovascular Exercise Tolerance: Good hypertension, Pt. on medications Normal cardiovascular exam Rhythm:Regular     Neuro/Psych negative neurological ROS  negative psych ROS   GI/Hepatic negative GI ROS, Neg liver ROS,,,  Endo/Other  negative endocrine ROS  Morbid obesity  Renal/GU negative Renal ROS     Musculoskeletal   Abdominal  (+) + obese  Peds negative pediatric ROS (+)  Hematology negative hematology ROS (+) Blood dyscrasia, anemia   Anesthesia Other Findings Past Medical History: No date: Abnormal Pap smear of cervix No date: Anemia No date: Essential hypertension No date: Uterine bleeding No date: Uterine fibroid No date: Uterine polyp  Past Surgical History: 06/25/2007: CESAREAN SECTION 11/06/2015: [place]; N/A     Comment:  Procedure: [place] WITH MY SURE;                Surgeon: Will Bonnet, MD;  Location: [facility] OR[facility];                Service: Gynecology;  Laterality: N/A; 11/06/2015: HYSTEROSCOPY; N/A     Comment:  Procedure: HYSTEROSCOPY WITH REMOVAL OF CERVICAL MASS;                Surgeon: Will Bonnet, MD;  Location: [facility] OR[facility];                Service: Gynecology;  Laterality: N/A; 03/21/2019: HYSTEROSCOPY WITH D & C; N/A     Comment:  Procedure: DILATATION AND CURETTAGE /HYSTEROSCOPY -               REMOVAL OF ENDOMETRIAL MASS;  Surgeon: Will Bonnet, MD;  Location: [facility] OR[facility];  Service: Gynecology;                Laterality: N/A; No date:  MYOMECTOMY 1989: TONSILLECTOMY  BMI    Body Mass Index: 49.21 kg/m      Reproductive/Obstetrics negative OB ROS                              Anesthesia Physical Anesthesia Plan  ASA: 3  Anesthesia Plan: General   Post-op Pain Management:    Induction: Intravenous  PONV Risk Score and Plan: Ondansetron, Dexamethasone, Midazolam and Treatment may vary due to age or medical condition  Airway Management Planned: Oral ETT  Additional Equipment:   Intra-op Plan:   Post-operative Plan: Extubation in OR  Informed Consent: I have reviewed the patients History and Physical, chart, labs and discussed the procedure including the risks, benefits and alternatives for the proposed anesthesia with the patient or authorized representative who has indicated his/her understanding and acceptance.     Dental Advisory Given  Plan Discussed with: CRNA and Surgeon  Anesthesia Plan Comments:         Anesthesia Quick Evaluation

## 2022-12-10 ENCOUNTER — Encounter: Payer: Self-pay | Admitting: Obstetrics and Gynecology

## 2022-12-10 NOTE — Anesthesia Postprocedure Evaluation (Signed)
Anesthesia Post Note  Patient: Paige Perez  Procedure(s) Performed: XI ROBOTIC ASSISTED LAPAROSCOPIC HYSTERECTOMY AND SALPINGECTOMY (Bilateral: Pelvis) CYSTOSCOPY (Bladder)  Patient location during evaluation: PACU Anesthesia Type: General Level of consciousness: awake and alert Pain management: pain level controlled Vital Signs Assessment: post-procedure vital signs reviewed and stable Respiratory status: spontaneous breathing, nonlabored ventilation, respiratory function stable and patient connected to nasal cannula oxygen Cardiovascular status: blood pressure returned to baseline and stable Postop Assessment: no apparent nausea or vomiting Anesthetic complications: no   No notable events documented.   Last Vitals:  Vitals:   12/09/22 1945 12/09/22 2000  BP:    Pulse: 70 64  Resp: 16   Temp:    SpO2: 93% 96%    Last Pain:  Vitals:   12/09/22 1945  TempSrc:   PainSc: 3                  Martha Clan

## 2022-12-13 LAB — SURGICAL PATHOLOGY
